# Patient Record
Sex: Female | Born: 1945 | Race: White | Hispanic: No | Marital: Married | Smoking: Former smoker
Health system: Southern US, Community
[De-identification: ages and names within clinical notes are randomized; demographics above are authoritative.]

## PROBLEM LIST (undated history)

## (undated) DIAGNOSIS — Z923 Personal history of irradiation: Secondary | ICD-10-CM

## (undated) DIAGNOSIS — I10 Essential (primary) hypertension: Secondary | ICD-10-CM

## (undated) DIAGNOSIS — E119 Type 2 diabetes mellitus without complications: Secondary | ICD-10-CM

## (undated) DIAGNOSIS — C342 Malignant neoplasm of middle lobe, bronchus or lung: Secondary | ICD-10-CM

## (undated) DIAGNOSIS — M797 Fibromyalgia: Secondary | ICD-10-CM

## (undated) DIAGNOSIS — R05 Cough: Secondary | ICD-10-CM

## (undated) DIAGNOSIS — C3490 Malignant neoplasm of unspecified part of unspecified bronchus or lung: Secondary | ICD-10-CM

## (undated) DIAGNOSIS — J439 Emphysema, unspecified: Secondary | ICD-10-CM

## (undated) DIAGNOSIS — E785 Hyperlipidemia, unspecified: Secondary | ICD-10-CM

## (undated) DIAGNOSIS — K219 Gastro-esophageal reflux disease without esophagitis: Secondary | ICD-10-CM

## (undated) HISTORY — DX: Hyperlipidemia, unspecified: E78.5

## (undated) HISTORY — DX: Cough: R05

## (undated) HISTORY — DX: Fibromyalgia: M79.7

## (undated) HISTORY — DX: Essential (primary) hypertension: I10

## (undated) HISTORY — DX: Malignant neoplasm of middle lobe, bronchus or lung: C34.2

## (undated) HISTORY — DX: Type 2 diabetes mellitus without complications: E11.9

## (undated) HISTORY — DX: Emphysema, unspecified: J43.9

---

## 2009-12-18 HISTORY — PX: LUNG BIOPSY: SHX232

## 2009-12-24 ENCOUNTER — Ambulatory Visit: Payer: Self-pay | Admitting: Oncology

## 2010-01-01 ENCOUNTER — Ambulatory Visit
Admission: RE | Admit: 2010-01-01 | Discharge: 2010-01-29 | Payer: Self-pay | Source: Home / Self Care | Admitting: Radiation Oncology

## 2010-01-02 LAB — CBC WITH DIFFERENTIAL/PLATELET
BASO%: 0.3 % (ref 0.0–2.0)
Basophils Absolute: 0 10*3/uL (ref 0.0–0.1)
EOS%: 1.6 % (ref 0.0–7.0)
Eosinophils Absolute: 0.2 10*3/uL (ref 0.0–0.5)
HCT: 45 % (ref 34.8–46.6)
HGB: 15.5 g/dL (ref 11.6–15.9)
LYMPH%: 17.4 % (ref 14.0–49.7)
MCH: 30.6 pg (ref 25.1–34.0)
MCHC: 34.4 g/dL (ref 31.5–36.0)
MCV: 88.7 fL (ref 79.5–101.0)
MONO#: 0.5 10*3/uL (ref 0.1–0.9)
MONO%: 5 % (ref 0.0–14.0)
NEUT#: 7.5 10*3/uL — ABNORMAL HIGH (ref 1.5–6.5)
NEUT%: 75.7 % (ref 38.4–76.8)
Platelets: 234 10*3/uL (ref 145–400)
RBC: 5.07 10*6/uL (ref 3.70–5.45)
RDW: 14 % (ref 11.2–14.5)
WBC: 9.9 10*3/uL (ref 3.9–10.3)
lymph#: 1.7 10*3/uL (ref 0.9–3.3)

## 2010-01-02 LAB — COMPREHENSIVE METABOLIC PANEL
ALT: 20 U/L (ref 0–35)
AST: 20 U/L (ref 0–37)
Albumin: 4.1 g/dL (ref 3.5–5.2)
Alkaline Phosphatase: 65 U/L (ref 39–117)
BUN: 10 mg/dL (ref 6–23)
CO2: 27 mEq/L (ref 19–32)
Calcium: 9.6 mg/dL (ref 8.4–10.5)
Chloride: 103 mEq/L (ref 96–112)
Creatinine, Ser: 0.75 mg/dL (ref 0.40–1.20)
Glucose, Bld: 119 mg/dL — ABNORMAL HIGH (ref 70–99)
Potassium: 3.9 mEq/L (ref 3.5–5.3)
Sodium: 139 mEq/L (ref 135–145)
Total Bilirubin: 0.9 mg/dL (ref 0.3–1.2)
Total Protein: 6.5 g/dL (ref 6.0–8.3)

## 2010-01-02 LAB — LACTATE DEHYDROGENASE: LDH: 129 U/L (ref 94–250)

## 2010-03-22 ENCOUNTER — Other Ambulatory Visit: Payer: Self-pay | Admitting: Radiation Oncology

## 2010-03-22 DIAGNOSIS — C3490 Malignant neoplasm of unspecified part of unspecified bronchus or lung: Secondary | ICD-10-CM

## 2010-04-17 ENCOUNTER — Ambulatory Visit (HOSPITAL_COMMUNITY)
Admission: RE | Admit: 2010-04-17 | Discharge: 2010-04-17 | Disposition: A | Discharge status: Home / Self Care | Payer: PRIVATE HEALTH INSURANCE | Source: Ambulatory Visit | Attending: Radiation Oncology | Admitting: Radiation Oncology

## 2010-04-17 ENCOUNTER — Encounter (HOSPITAL_COMMUNITY): Payer: Self-pay

## 2010-04-17 DIAGNOSIS — C3490 Malignant neoplasm of unspecified part of unspecified bronchus or lung: Secondary | ICD-10-CM

## 2010-04-17 DIAGNOSIS — J8410 Pulmonary fibrosis, unspecified: Secondary | ICD-10-CM | POA: Insufficient documentation

## 2010-04-17 DIAGNOSIS — I700 Atherosclerosis of aorta: Secondary | ICD-10-CM | POA: Insufficient documentation

## 2010-04-17 DIAGNOSIS — Z85118 Personal history of other malignant neoplasm of bronchus and lung: Secondary | ICD-10-CM | POA: Insufficient documentation

## 2010-04-17 HISTORY — DX: Malignant neoplasm of unspecified part of unspecified bronchus or lung: C34.90

## 2010-04-24 ENCOUNTER — Ambulatory Visit: Payer: PRIVATE HEALTH INSURANCE | Attending: Radiation Oncology | Admitting: Radiation Oncology

## 2010-05-28 ENCOUNTER — Other Ambulatory Visit: Payer: Self-pay | Admitting: Oncology

## 2010-05-28 ENCOUNTER — Encounter (HOSPITAL_BASED_OUTPATIENT_CLINIC_OR_DEPARTMENT_OTHER): Payer: PRIVATE HEALTH INSURANCE | Admitting: Oncology

## 2010-05-28 DIAGNOSIS — J438 Other emphysema: Secondary | ICD-10-CM

## 2010-05-28 DIAGNOSIS — C342 Malignant neoplasm of middle lobe, bronchus or lung: Secondary | ICD-10-CM

## 2010-05-28 LAB — CBC WITH DIFFERENTIAL/PLATELET
BASO%: 0.5 % (ref 0.0–2.0)
Basophils Absolute: 0 10*3/uL (ref 0.0–0.1)
EOS%: 1.5 % (ref 0.0–7.0)
Eosinophils Absolute: 0.1 10*3/uL (ref 0.0–0.5)
HCT: 42.5 % (ref 34.8–46.6)
HGB: 14.3 g/dL (ref 11.6–15.9)
LYMPH%: 10.1 % — ABNORMAL LOW (ref 14.0–49.7)
MCH: 30.5 pg (ref 25.1–34.0)
MCHC: 33.8 g/dL (ref 31.5–36.0)
MCV: 90.2 fL (ref 79.5–101.0)
MONO#: 0.2 10*3/uL (ref 0.1–0.9)
MONO%: 2.4 % (ref 0.0–14.0)
NEUT#: 7.4 10*3/uL — ABNORMAL HIGH (ref 1.5–6.5)
NEUT%: 85.5 % — ABNORMAL HIGH (ref 38.4–76.8)
Platelets: 164 10*3/uL (ref 145–400)
RBC: 4.71 10*6/uL (ref 3.70–5.45)
RDW: 13.4 % (ref 11.2–14.5)
WBC: 8.7 10*3/uL (ref 3.9–10.3)
lymph#: 0.9 10*3/uL (ref 0.9–3.3)

## 2010-05-28 LAB — COMPREHENSIVE METABOLIC PANEL
ALT: 11 U/L (ref 0–35)
AST: 9 U/L (ref 0–37)
Albumin: 4.1 g/dL (ref 3.5–5.2)
Alkaline Phosphatase: 63 U/L (ref 39–117)
BUN: 10 mg/dL (ref 6–23)
CO2: 28 mEq/L (ref 19–32)
Calcium: 9.1 mg/dL (ref 8.4–10.5)
Chloride: 102 mEq/L (ref 96–112)
Creatinine, Ser: 0.75 mg/dL (ref 0.40–1.20)
Glucose, Bld: 155 mg/dL — ABNORMAL HIGH (ref 70–99)
Potassium: 4.1 mEq/L (ref 3.5–5.3)
Sodium: 141 mEq/L (ref 135–145)
Total Bilirubin: 0.6 mg/dL (ref 0.3–1.2)
Total Protein: 6.2 g/dL (ref 6.0–8.3)

## 2010-05-28 LAB — LACTATE DEHYDROGENASE: LDH: 137 U/L (ref 94–250)

## 2010-06-04 ENCOUNTER — Other Ambulatory Visit: Payer: Self-pay | Admitting: Oncology

## 2010-06-04 ENCOUNTER — Encounter (HOSPITAL_BASED_OUTPATIENT_CLINIC_OR_DEPARTMENT_OTHER): Payer: PRIVATE HEALTH INSURANCE | Admitting: Oncology

## 2010-06-04 DIAGNOSIS — I10 Essential (primary) hypertension: Secondary | ICD-10-CM

## 2010-06-04 DIAGNOSIS — J438 Other emphysema: Secondary | ICD-10-CM

## 2010-06-04 DIAGNOSIS — E785 Hyperlipidemia, unspecified: Secondary | ICD-10-CM

## 2010-06-04 DIAGNOSIS — C342 Malignant neoplasm of middle lobe, bronchus or lung: Secondary | ICD-10-CM

## 2010-06-04 DIAGNOSIS — C3490 Malignant neoplasm of unspecified part of unspecified bronchus or lung: Secondary | ICD-10-CM

## 2010-10-08 ENCOUNTER — Other Ambulatory Visit: Payer: Self-pay | Admitting: Oncology

## 2010-10-08 ENCOUNTER — Encounter (HOSPITAL_BASED_OUTPATIENT_CLINIC_OR_DEPARTMENT_OTHER): Payer: PRIVATE HEALTH INSURANCE | Admitting: Oncology

## 2010-10-08 ENCOUNTER — Ambulatory Visit (HOSPITAL_COMMUNITY)
Admission: RE | Admit: 2010-10-08 | Discharge: 2010-10-08 | Disposition: A | Discharge status: Home / Self Care | Payer: PRIVATE HEALTH INSURANCE | Source: Ambulatory Visit | Attending: Oncology | Admitting: Oncology

## 2010-10-08 ENCOUNTER — Encounter (HOSPITAL_COMMUNITY): Payer: Self-pay

## 2010-10-08 DIAGNOSIS — J984 Other disorders of lung: Secondary | ICD-10-CM | POA: Insufficient documentation

## 2010-10-08 DIAGNOSIS — J438 Other emphysema: Secondary | ICD-10-CM | POA: Insufficient documentation

## 2010-10-08 DIAGNOSIS — I080 Rheumatic disorders of both mitral and aortic valves: Secondary | ICD-10-CM | POA: Insufficient documentation

## 2010-10-08 DIAGNOSIS — C3490 Malignant neoplasm of unspecified part of unspecified bronchus or lung: Secondary | ICD-10-CM

## 2010-10-08 DIAGNOSIS — J9819 Other pulmonary collapse: Secondary | ICD-10-CM | POA: Insufficient documentation

## 2010-10-08 DIAGNOSIS — C342 Malignant neoplasm of middle lobe, bronchus or lung: Secondary | ICD-10-CM

## 2010-10-08 HISTORY — DX: Essential (primary) hypertension: I10

## 2010-10-08 LAB — CMP (CANCER CENTER ONLY)
ALT(SGPT): 15 U/L (ref 10–47)
AST: 12 U/L (ref 11–38)
Albumin: 3.7 g/dL (ref 3.3–5.5)
Alkaline Phosphatase: 58 U/L (ref 26–84)
Potassium: 4.6 mEq/L (ref 3.3–4.7)
Sodium: 136 mEq/L (ref 128–145)
Total Bilirubin: 0.8 mg/dl (ref 0.20–1.60)
Total Protein: 6.3 g/dL — ABNORMAL LOW (ref 6.4–8.1)

## 2010-10-08 LAB — CBC WITH DIFFERENTIAL/PLATELET
BASO%: 0.1 % (ref 0.0–2.0)
LYMPH%: 8.4 % — ABNORMAL LOW (ref 14.0–49.7)
MCHC: 34.3 g/dL (ref 31.5–36.0)
MCV: 90 fL (ref 79.5–101.0)
MONO%: 1.4 % (ref 0.0–14.0)
NEUT#: 9.2 10*3/uL — ABNORMAL HIGH (ref 1.5–6.5)
Platelets: 208 10*3/uL (ref 145–400)
RBC: 4.87 10*6/uL (ref 3.70–5.45)
RDW: 14.1 % (ref 11.2–14.5)
WBC: 10.3 10*3/uL (ref 3.9–10.3)

## 2010-10-08 MED ORDER — IOHEXOL 300 MG/ML  SOLN
80.0000 mL | Freq: Once | INTRAMUSCULAR | Status: AC | PRN
  Administered 2010-10-08: 80 mL via INTRAVENOUS

## 2010-10-15 ENCOUNTER — Encounter (HOSPITAL_BASED_OUTPATIENT_CLINIC_OR_DEPARTMENT_OTHER): Payer: PRIVATE HEALTH INSURANCE | Admitting: Oncology

## 2010-10-15 DIAGNOSIS — C342 Malignant neoplasm of middle lobe, bronchus or lung: Secondary | ICD-10-CM

## 2010-10-15 DIAGNOSIS — J438 Other emphysema: Secondary | ICD-10-CM

## 2010-10-15 DIAGNOSIS — Z9981 Dependence on supplemental oxygen: Secondary | ICD-10-CM

## 2010-10-15 DIAGNOSIS — I10 Essential (primary) hypertension: Secondary | ICD-10-CM

## 2011-01-25 ENCOUNTER — Telehealth: Payer: Self-pay | Admitting: Oncology

## 2011-01-25 NOTE — Telephone Encounter (Signed)
Talked to pt, gave her MD appt in February.Reminded pt that scan and lab are still scheduled for 12/19th

## 2011-02-19 ENCOUNTER — Other Ambulatory Visit (HOSPITAL_BASED_OUTPATIENT_CLINIC_OR_DEPARTMENT_OTHER): Payer: PRIVATE HEALTH INSURANCE | Admitting: Lab

## 2011-02-19 ENCOUNTER — Other Ambulatory Visit: Payer: Self-pay | Admitting: Oncology

## 2011-02-19 ENCOUNTER — Ambulatory Visit (HOSPITAL_COMMUNITY)
Admission: RE | Admit: 2011-02-19 | Discharge: 2011-02-19 | Disposition: A | Discharge status: Home / Self Care | Payer: Medicare Other | Source: Ambulatory Visit | Attending: Oncology | Admitting: Oncology

## 2011-02-19 DIAGNOSIS — Z85118 Personal history of other malignant neoplasm of bronchus and lung: Secondary | ICD-10-CM | POA: Insufficient documentation

## 2011-02-19 DIAGNOSIS — C342 Malignant neoplasm of middle lobe, bronchus or lung: Secondary | ICD-10-CM

## 2011-02-19 DIAGNOSIS — C801 Malignant (primary) neoplasm, unspecified: Secondary | ICD-10-CM

## 2011-02-19 DIAGNOSIS — R05 Cough: Secondary | ICD-10-CM | POA: Insufficient documentation

## 2011-02-19 DIAGNOSIS — R0602 Shortness of breath: Secondary | ICD-10-CM | POA: Insufficient documentation

## 2011-02-19 DIAGNOSIS — I10 Essential (primary) hypertension: Secondary | ICD-10-CM

## 2011-02-19 DIAGNOSIS — J438 Other emphysema: Secondary | ICD-10-CM

## 2011-02-19 DIAGNOSIS — J449 Chronic obstructive pulmonary disease, unspecified: Secondary | ICD-10-CM | POA: Insufficient documentation

## 2011-02-19 DIAGNOSIS — R059 Cough, unspecified: Secondary | ICD-10-CM | POA: Insufficient documentation

## 2011-02-19 DIAGNOSIS — Z9981 Dependence on supplemental oxygen: Secondary | ICD-10-CM

## 2011-02-19 DIAGNOSIS — J4489 Other specified chronic obstructive pulmonary disease: Secondary | ICD-10-CM | POA: Insufficient documentation

## 2011-02-19 LAB — CBC WITH DIFFERENTIAL/PLATELET
BASO%: 0.1 % (ref 0.0–2.0)
EOS%: 0.7 % (ref 0.0–7.0)
Eosinophils Absolute: 0.1 10*3/uL (ref 0.0–0.5)
LYMPH%: 18.1 % (ref 14.0–49.7)
MCH: 29.7 pg (ref 25.1–34.0)
MCHC: 33.9 g/dL (ref 31.5–36.0)
MCV: 87.5 fL (ref 79.5–101.0)
MONO%: 7.6 % (ref 0.0–14.0)
Platelets: 227 10*3/uL (ref 145–400)
RBC: 4.98 10*6/uL (ref 3.70–5.45)

## 2011-02-19 LAB — COMPREHENSIVE METABOLIC PANEL
AST: 15 U/L (ref 0–37)
Alkaline Phosphatase: 67 U/L (ref 39–117)
Glucose, Bld: 113 mg/dL — ABNORMAL HIGH (ref 70–99)
Sodium: 141 mEq/L (ref 135–145)
Total Bilirubin: 0.4 mg/dL (ref 0.3–1.2)
Total Protein: 6.8 g/dL (ref 6.0–8.3)

## 2011-02-19 LAB — LACTATE DEHYDROGENASE: LDH: 157 U/L (ref 94–250)

## 2011-03-13 ENCOUNTER — Telehealth: Payer: Self-pay | Admitting: *Deleted

## 2011-03-13 NOTE — Telephone Encounter (Signed)
Received call from pt. Stating that her right side of chest hurts & has been worse last several days.  She had a CXR recently but hasn't heard results & wonders if she has a broken rib.  She reports her pain # 8 on scale of 1-10 but mainly hurts getting up & down or  moving  & hurts some with a deep breath.  She reports no other symptoms, no cold, no fever, no congestion, etc.   She is taking ibuprofen qid & doesn't want anything stronger & states this helps some.  Suggested heat for 10-15" qid & will discuss Dr. Truett Perna.

## 2011-03-13 NOTE — Telephone Encounter (Signed)
Discussed above with Dr. Truett Perna & he suggested pt call PCP or go to ED if worse.  The pt was notified of this.

## 2011-04-18 ENCOUNTER — Ambulatory Visit (HOSPITAL_BASED_OUTPATIENT_CLINIC_OR_DEPARTMENT_OTHER): Payer: Medicare Other | Admitting: Oncology

## 2011-04-18 ENCOUNTER — Encounter: Payer: Self-pay | Admitting: Oncology

## 2011-04-18 VITALS — BP 137/79 | HR 87 | Temp 98.0°F | Ht 65.5 in | Wt 157.7 lb

## 2011-04-18 DIAGNOSIS — R053 Chronic cough: Secondary | ICD-10-CM

## 2011-04-18 DIAGNOSIS — J449 Chronic obstructive pulmonary disease, unspecified: Secondary | ICD-10-CM

## 2011-04-18 DIAGNOSIS — C3490 Malignant neoplasm of unspecified part of unspecified bronchus or lung: Secondary | ICD-10-CM

## 2011-04-18 DIAGNOSIS — J209 Acute bronchitis, unspecified: Secondary | ICD-10-CM

## 2011-04-18 DIAGNOSIS — C342 Malignant neoplasm of middle lobe, bronchus or lung: Secondary | ICD-10-CM

## 2011-04-18 DIAGNOSIS — J42 Unspecified chronic bronchitis: Secondary | ICD-10-CM

## 2011-04-18 DIAGNOSIS — J439 Emphysema, unspecified: Secondary | ICD-10-CM

## 2011-04-18 DIAGNOSIS — E119 Type 2 diabetes mellitus without complications: Secondary | ICD-10-CM

## 2011-04-18 DIAGNOSIS — R05 Cough: Secondary | ICD-10-CM | POA: Insufficient documentation

## 2011-04-18 DIAGNOSIS — M797 Fibromyalgia: Secondary | ICD-10-CM

## 2011-04-18 DIAGNOSIS — I10 Essential (primary) hypertension: Secondary | ICD-10-CM

## 2011-04-18 DIAGNOSIS — Z85118 Personal history of other malignant neoplasm of bronchus and lung: Secondary | ICD-10-CM

## 2011-04-18 DIAGNOSIS — E785 Hyperlipidemia, unspecified: Secondary | ICD-10-CM

## 2011-04-18 HISTORY — DX: Essential (primary) hypertension: I10

## 2011-04-18 HISTORY — DX: Malignant neoplasm of middle lobe, bronchus or lung: C34.2

## 2011-04-18 HISTORY — DX: Fibromyalgia: M79.7

## 2011-04-18 HISTORY — DX: Chronic cough: R05.3

## 2011-04-18 HISTORY — DX: Emphysema, unspecified: J43.9

## 2011-04-18 HISTORY — DX: Type 2 diabetes mellitus without complications: E11.9

## 2011-04-18 HISTORY — DX: Hyperlipidemia, unspecified: E78.5

## 2011-04-18 NOTE — Progress Notes (Signed)
Hematology and Oncology Follow Up Visit  Teresa Anthony [id number] November 11, 1945 66 y.o. 04/18/2011 4:42 PM   Principle Diagnosis: Encounter Diagnoses  Name Primary?  . Non-small cell lung cancer Yes  . Lung cancer, middle lobe   . COPD (chronic obstructive pulmonary disease) with emphysema   . Chronic cough   . Benign essential HTN   . DM type 2 (diabetes mellitus, type 2)   . Hyperlipidemia   . Fibromyalgia syndrome   . 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 She was treated with primary radiation. Between November 17 and 01/29/2010. She achieved a complete radiographic response. She has a chronic smoker's cough. Cough got worse about 3 weeks ago. She saw her internist. She was put on a 10 day course of antibiotics. She had a significant component of right pleuritic chest pain. In view of her cancer history a CT scan of the chest was done on 03/26/2011 at the [facility] in [place]. She brought me a copy of the CD but I can't get a computed to read it. Report states a new infiltrate identified in the right midlung which is probably infectious. No mediastinal adenopathy. No pleural effusions. No new fractures but an old right rib fracture identified. Short interval followup study was recommended. This was done on February 9. I don't have the report for that study but patient was told that things were improving. Her cough has improved back to her baseline. The pleuritic chest discomfort has also nearly completely resolved. She's had no other interim medical problems.   Medications: reviewed  Allergies: Not on File  Review of Systems: Constitutional:   No constitutional symptoms other than chronic fatigue Respiratory: See  above Cardiovascular:  No ischemic type chest pain pressure or palpitations Gastrointestinal: No abdominal pain change in bowel habit Genito-Urinary: No urinary tract Musculoskeletal: Chronic muscle ache Neurologic: No severe headache or change in vision Skin: No rash Remaining ROS negative.  Physical Exam: Blood pressure 137/79, pulse 87, temperature 98 F (36.7 C), temperature source Oral, height 5' 5.5" (1.664 m), weight 157 lb 11.2 oz (71.532 kg). Wt Readings from Last 3 Encounters:  04/18/11 157 lb 11.2 oz (71.532 kg)     General appearance: Chronically ill-appearing face is flushed HENNT: Pharynx no erythema or exudate Lymph nodes: No cervical supraclavicular or axillary adenopathy  Breasts: Not examined Lungs: Clear to auscultation prolonged expiratory phase with some expiratory wheezes resonant to percussion throughout no egophony Heart: Regular cardiac rhythm no murmur no gallop Abdomen: Soft nontender no mass no organomegaly Extremities: No edema no calf tenderness Vascular: No cyanosis Neurologic: Alert oriented motor strength 5 over 5 reflexes 2+ symmetric Skin: No rash or ecchymosis  Lab Results: Lab Results  Component Value Date   WBC 9.6 02/19/2011   HGB 14.8 02/19/2011   HCT 43.6 02/19/2011   MCV 87.5 02/19/2011   PLT 227 02/19/2011     Chemistry      Component Value Date/Time   NA 141 02/19/2011 1323   NA 136 10/08/2010 0953   K 3.9 02/19/2011 1323   K 4.6 10/08/2010 0953   CL 105 02/19/2011 1323   CL 95* 10/08/2010 0953   CO2 22 02/19/2011 1323   CO2 27 10/08/2010 0953   BUN 15 02/19/2011 1323  BUN 17 10/08/2010 0953   CREATININE 0.69 02/19/2011 1323   CREATININE 0.8 10/08/2010 0953      Component Value Date/Time   CALCIUM 9.8 02/19/2011 1323   CALCIUM 9.0 10/08/2010 0953   ALKPHOS 67 02/19/2011 1323   ALKPHOS 58 10/08/2010 0953   AST 15 02/19/2011 1323   AST 12 10/08/2010 0953   ALT 14 02/19/2011 1323   BILITOT 0.4 02/19/2011 1323   BILITOT 0.80  10/08/2010 4098       Radiological Studies: See discussion above   Impression and Plan: #1. Clinical stage I squamous cell carcinoma right lung treated with primary radiation. She remains free of any obvious new disease now out a little over 2 years from diagnosis in October 2011. I'll repeat a CT scan again in 4 months. Increase interval between scans about one is stable.  #2. Recent acute bronchitis treated as outlined above. No convincing evidence for new cancer on CT scans done in January and February of this year.  #3. Oxygen dependent obstructive airway disease.  #4. Type 2 diabetes on oral agent.  #5. Fibromyalgia syndrome by history.  #6. Essential hypertension.  #7. Hyperlipidemia   CC:. Dr. Synetta Fail; Dr. Kenney Houseman; Dr. Chipper Herb   Levert Feinstein, MD 2/15/20134:42 PM

## 2011-08-08 ENCOUNTER — Other Ambulatory Visit (HOSPITAL_BASED_OUTPATIENT_CLINIC_OR_DEPARTMENT_OTHER): Payer: Medicare Other

## 2011-08-08 ENCOUNTER — Ambulatory Visit (HOSPITAL_COMMUNITY)
Admission: RE | Admit: 2011-08-08 | Discharge: 2011-08-08 | Disposition: A | Discharge status: Home / Self Care | Payer: Medicare Other | Source: Ambulatory Visit | Attending: Oncology | Admitting: Oncology

## 2011-08-08 ENCOUNTER — Telehealth: Payer: Self-pay | Admitting: *Deleted

## 2011-08-08 ENCOUNTER — Encounter (HOSPITAL_COMMUNITY): Payer: Self-pay

## 2011-08-08 DIAGNOSIS — I059 Rheumatic mitral valve disease, unspecified: Secondary | ICD-10-CM | POA: Insufficient documentation

## 2011-08-08 DIAGNOSIS — K7689 Other specified diseases of liver: Secondary | ICD-10-CM | POA: Insufficient documentation

## 2011-08-08 DIAGNOSIS — J984 Other disorders of lung: Secondary | ICD-10-CM | POA: Insufficient documentation

## 2011-08-08 DIAGNOSIS — I7789 Other specified disorders of arteries and arterioles: Secondary | ICD-10-CM | POA: Insufficient documentation

## 2011-08-08 DIAGNOSIS — Z923 Personal history of irradiation: Secondary | ICD-10-CM | POA: Insufficient documentation

## 2011-08-08 DIAGNOSIS — C3490 Malignant neoplasm of unspecified part of unspecified bronchus or lung: Secondary | ICD-10-CM | POA: Insufficient documentation

## 2011-08-08 DIAGNOSIS — I700 Atherosclerosis of aorta: Secondary | ICD-10-CM | POA: Insufficient documentation

## 2011-08-08 LAB — CBC WITH DIFFERENTIAL/PLATELET
Basophils Absolute: 0 10*3/uL (ref 0.0–0.1)
Eosinophils Absolute: 0.1 10*3/uL (ref 0.0–0.5)
HCT: 42.8 % (ref 34.8–46.6)
HGB: 14.6 g/dL (ref 11.6–15.9)
LYMPH%: 18.2 % (ref 14.0–49.7)
MCV: 89.9 fL (ref 79.5–101.0)
MONO%: 8.1 % (ref 0.0–14.0)
NEUT#: 5.7 10*3/uL (ref 1.5–6.5)
Platelets: 222 10*3/uL (ref 145–400)

## 2011-08-08 LAB — CMP (CANCER CENTER ONLY)
Albumin: 4.2 g/dL (ref 3.3–5.5)
Alkaline Phosphatase: 66 U/L (ref 26–84)
BUN, Bld: 14 mg/dL (ref 7–22)
Glucose, Bld: 94 mg/dL (ref 73–118)
Total Bilirubin: 0.9 mg/dl (ref 0.20–1.60)

## 2011-08-08 MED ORDER — IOHEXOL 300 MG/ML  SOLN
80.0000 mL | Freq: Once | INTRAMUSCULAR | Status: AC | PRN
  Administered 2011-08-08: 80 mL via INTRAVENOUS

## 2011-08-08 NOTE — Telephone Encounter (Signed)
Pt notified of CT results per Dr Granfortuna.  

## 2011-08-08 NOTE — Telephone Encounter (Signed)
Message copied by Sabino Snipes on Fri Aug 08, 2011  5:52 PM ------      Message from: Levert Feinstein      Created: Fri Aug 08, 2011  5:29 PM       Call pt CT chest negative for lung ca

## 2011-08-15 ENCOUNTER — Ambulatory Visit (HOSPITAL_BASED_OUTPATIENT_CLINIC_OR_DEPARTMENT_OTHER): Payer: Medicare Other | Admitting: Nurse Practitioner

## 2011-08-15 VITALS — BP 125/75 | HR 97 | Temp 97.3°F | Ht 65.5 in | Wt 159.3 lb

## 2011-08-15 DIAGNOSIS — E119 Type 2 diabetes mellitus without complications: Secondary | ICD-10-CM

## 2011-08-15 DIAGNOSIS — C342 Malignant neoplasm of middle lobe, bronchus or lung: Secondary | ICD-10-CM

## 2011-08-15 DIAGNOSIS — I10 Essential (primary) hypertension: Secondary | ICD-10-CM

## 2011-08-15 DIAGNOSIS — J988 Other specified respiratory disorders: Secondary | ICD-10-CM

## 2011-08-15 NOTE — Progress Notes (Signed)
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 She was treated with primary radiation 01/17/2010 through 01/29/2010. She achieved a complete radiographic response.  Most recent chest CT 08/08/2011 showed no evidence of local recurrence or metastatic disease. There were stable scarring and emphysematous changes in both lungs. No lymphadenopathy or pleural effusion. There were old rib fractures or thoracotomy defect laterally on the right.  Ms. Neuwirth reports that overall she feels well. No interim illnesses or infections. She has stable dyspnea. She utilizes supplemental oxygen at nighttime and during the day as needed at 2 L per minute. Cough is markedly improved. She continues to have intermittent mild "pleurisy" at the right lateral chest. She has a good appetite. Bowels moving regularly. No urinary complaints.  Objective: Blood pressure 125/75, pulse 97, temperature 97.3 F (36.3 C), temperature source Oral, height 5' 5.5" (1.664 m), weight 159 lb 4.8 oz (72.258 kg).  Oropharynx is without thrush or ulceration. Mucous membranes are pink and moist. No palpable cervical, supraclavicular or axillary lymph nodes. Lungs with scattered wheezes. Regular cardiac rhythm. Abdomen soft and nontender. No hepatomegaly. Extremities are without edema. Calves are soft and nontender. Motor strength 5 over 5. Knee DTRs 3+, symmetric.  Lab Results: Lab Results  Component Value Date   WBC 7.9 08/08/2011   HGB 14.6 08/08/2011   HCT 42.8 08/08/2011   MCV 89.9 08/08/2011   PLT 222 08/08/2011    Chemistry:    Chemistry      Component Value Date/Time   NA 144 08/08/2011 1344   NA 141 02/19/2011 1323   K 4.6 08/08/2011 1344   K 3.9 02/19/2011 1323   CL 103 08/08/2011 1344   CL 105 02/19/2011 1323   CO2 28 08/08/2011 1344   CO2 22 02/19/2011 1323   BUN 14 08/08/2011 1344   BUN 15 02/19/2011 1323   CREATININE 0.9 08/08/2011 1344   CREATININE 0.69 02/19/2011 1323      Component Value Date/Time   CALCIUM 9.3 08/08/2011 1344   CALCIUM 9.8 02/19/2011 1323   ALKPHOS 66 08/08/2011 1344   ALKPHOS 67 02/19/2011 1323   AST 21 08/08/2011 1344   AST 15 02/19/2011 1323   ALT 14 02/19/2011 1323   BILITOT 0.90 08/08/2011 1344   BILITOT 0.4 02/19/2011 1323       Studies/Results: Ct Chest W Contrast  08/08/2011  *RADIOLOGY REPORT*  Clinical Data: Follow-up lung cancer diagnosed in 2011 status post radiation therapy completion.  CT CHEST WITH CONTRAST  Technique:  Multidetector CT imaging of the chest was performed following the standard protocol during bolus administration of intravenous contrast.  Contrast: 80mL OMNIPAQUE IOHEXOL 300 MG/ML  SOLN  Comparison: chest CT 10/08/2010 and 04/17/2010.  Findings: There are no enlarged mediastinal or hilar lymph nodes. A small amount of pericardial thickening anteriorly appears stable. There is no pleural or pericardial effusion.  Mitral annular calcifications and mild aortic atherosclerosis are again noted. There is central enlargement of the pulmonary arteries consistent with pulmonary arterial hypertension.  The lungs appear stable.  There are emphysematous changes with scarring in the right middle and lower lobes and along the left major fissure.  No recurrent mass or endobronchial lesion is seen.  Images through the upper abdomen demonstrate no adrenal mass. There is a small hypervascular lesion peripherally in the posterior segment of the right hepatic lobe.  This measures 8 mm in diameter  on image 52.  This is not as well seen on the prior studies, although was probably present on the examination from 10 months ago.  There are no suspicious osseous findings.  There are old rib fractures or thoracotomy defects laterally on the right.  No bone destruction is evident.  IMPRESSION:  1.  Stable scarring and emphysematous changes in both lungs.  No evidence of local  recurrence or metastatic disease. 2.  No lymphadenopathy or pleural effusion. 3.  Old rib fractures or thoracotomy defect on the right. 4.  Small hypervascular liver lesion, probably previously present and therefore likely a hemangioma or transient hepatic attenuation difference.  Metastatic disease is considered unlikely.  Original Report Authenticated By: Gerrianne Scale, M.D.    Medications: I have reviewed the patient's current medications.  Assessment/Plan:  1. Clinical stage I squamous cell carcinoma, right middle lobe, (T1b Nx M0) status post primary radiation 11/17 through 01/29/2010 with a complete response. 2. Advanced obstructive airway disease, oxygen dependent.   3. Hypertension -- she takes Cozaar and Norvasc.  4. Type 2 diabetes--on Metformin. 5. Hyperlipidemia.  Disposition-Ms. Quintanilla appears stable. She will return for a chest x-ray and followup visit in 6 months. She will contact the office in the interim with any problems.  Plan reviewed with Dr. Cyndie Chime.  Lonna Cobb ANP/GNP-BC

## 2011-08-22 ENCOUNTER — Telehealth: Payer: Self-pay | Admitting: Oncology

## 2011-08-22 NOTE — Telephone Encounter (Signed)
called pt and provided appt for 12/13 along with CXR appt same day

## 2012-01-16 ENCOUNTER — Telehealth: Payer: Self-pay | Admitting: Oncology

## 2012-01-16 ENCOUNTER — Other Ambulatory Visit: Payer: Self-pay | Admitting: Nurse Practitioner

## 2012-01-16 DIAGNOSIS — C342 Malignant neoplasm of middle lobe, bronchus or lung: Secondary | ICD-10-CM

## 2012-01-16 NOTE — Telephone Encounter (Signed)
Pt aware to have x-ray before MD visit on December 2013

## 2012-02-06 ENCOUNTER — Ambulatory Visit (HOSPITAL_COMMUNITY)
Admission: RE | Admit: 2012-02-06 | Discharge: 2012-02-06 | Disposition: A | Discharge status: Home / Self Care | Payer: Medicare Other | Source: Ambulatory Visit | Attending: Nurse Practitioner | Admitting: Nurse Practitioner

## 2012-02-06 DIAGNOSIS — C342 Malignant neoplasm of middle lobe, bronchus or lung: Secondary | ICD-10-CM

## 2012-02-06 DIAGNOSIS — J438 Other emphysema: Secondary | ICD-10-CM | POA: Insufficient documentation

## 2012-02-06 DIAGNOSIS — J984 Other disorders of lung: Secondary | ICD-10-CM | POA: Insufficient documentation

## 2012-02-13 ENCOUNTER — Ambulatory Visit (HOSPITAL_BASED_OUTPATIENT_CLINIC_OR_DEPARTMENT_OTHER): Payer: Medicare Other | Admitting: Oncology

## 2012-02-13 ENCOUNTER — Other Ambulatory Visit (HOSPITAL_BASED_OUTPATIENT_CLINIC_OR_DEPARTMENT_OTHER): Payer: Medicare Other | Admitting: Lab

## 2012-02-13 ENCOUNTER — Telehealth: Payer: Self-pay | Admitting: Oncology

## 2012-02-13 VITALS — BP 156/73 | HR 101 | Temp 98.6°F | Resp 20 | Ht 65.5 in | Wt 164.6 lb

## 2012-02-13 DIAGNOSIS — J4489 Other specified chronic obstructive pulmonary disease: Secondary | ICD-10-CM

## 2012-02-13 DIAGNOSIS — R05 Cough: Secondary | ICD-10-CM

## 2012-02-13 DIAGNOSIS — C342 Malignant neoplasm of middle lobe, bronchus or lung: Secondary | ICD-10-CM

## 2012-02-13 DIAGNOSIS — J449 Chronic obstructive pulmonary disease, unspecified: Secondary | ICD-10-CM

## 2012-02-13 DIAGNOSIS — J439 Emphysema, unspecified: Secondary | ICD-10-CM

## 2012-02-13 DIAGNOSIS — IMO0001 Reserved for inherently not codable concepts without codable children: Secondary | ICD-10-CM

## 2012-02-13 DIAGNOSIS — R059 Cough, unspecified: Secondary | ICD-10-CM

## 2012-02-13 LAB — COMPREHENSIVE METABOLIC PANEL (CC13)
AST: 12 U/L (ref 5–34)
Albumin: 3.7 g/dL (ref 3.5–5.0)
BUN: 13 mg/dL (ref 7.0–26.0)
Calcium: 9.7 mg/dL (ref 8.4–10.4)
Chloride: 103 mEq/L (ref 98–107)
Potassium: 3.7 mEq/L (ref 3.5–5.1)
Sodium: 143 mEq/L (ref 136–145)
Total Protein: 6.4 g/dL (ref 6.4–8.3)

## 2012-02-13 LAB — CBC WITH DIFFERENTIAL/PLATELET
Basophils Absolute: 0 10*3/uL (ref 0.0–0.1)
EOS%: 0.3 % (ref 0.0–7.0)
Eosinophils Absolute: 0 10*3/uL (ref 0.0–0.5)
HGB: 14.5 g/dL (ref 11.6–15.9)
MCH: 30.5 pg (ref 25.1–34.0)
NEUT#: 10.6 10*3/uL — ABNORMAL HIGH (ref 1.5–6.5)
RDW: 13.9 % (ref 11.2–14.5)
lymph#: 3 10*3/uL (ref 0.9–3.3)

## 2012-02-13 NOTE — Progress Notes (Signed)
Hematology and Oncology Follow Up Visit  Teresa Anthony [id number] January 27, 1946 66 y.o. 02/13/2012 3:26 PM   Principle Diagnosis: Encounter Diagnoses  Name Primary?  . Lung cancer, middle lobe Yes  . 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 She was treated with primary radiation. Between November 17 and 01/29/2010. She achieved a complete radiographic response. She has had recurrent episodes of bronchitis. She had a another episode beginning about a month ago. She began to get pleuritic chest pain. She believes she waited too long before seeking medical attention. She is currently finishing a course of antibiotics and steroids. Cough is back down to her baseline chronic cough.    Medications: reviewed  Allergies:  Allergies  Allergen Reactions  . Codeine Itching and Rash    Diffuse, severe rash    Review of Systems: Constitutional:   Chronic fatigue Respiratory: Chronic dyspnea Cardiovascular:  No chest pain or palpitations Gastrointestinal: No change in bowel habit Genito-Urinary: Not questioned Musculoskeletal: Chronic myalgias and arthralgias Neurologic: Chronic headaches but not migraine Skin: Bruising due to steroids Remaining ROS negative.  Physical Exam: Blood pressure 156/73, pulse 101, temperature 98.6 F (37 C), temperature source Oral, resp. rate 20, height 5' 5.5" (1.664 m), weight 164 lb 9.6 oz (74.662 kg). Wt Readings from Last 3 Encounters:  02/13/12 164 lb 9.6 oz (74.662 kg)  08/15/11 159 lb 4.8 oz (72.258 kg)  04/18/11 157 lb 11.2 oz (71.532 kg)     General appearance: Chronically ill-appearing Caucasian woman HENNT: Mildly cushingoid. Pharynx no erythema or exudate Lymph nodes: No  adenopathy Breasts: Lungs: overall clear to auscultation and resonant to percussion with scattered inspiratory and expiratory wheezing Heart: Regular rhythm no murmur Abdomen: Soft nontender, no mass, no organomegaly Extremities: No edema, no calf tenderness Vascular: No cyanosis Neurologic: Motor strength 5 over 5, reflexes 2+ symmetric Skin: Scattered ecchymoses on her hands  Lab Results: Lab Results  Component Value Date   WBC 14.7* 02/13/2012   HGB 14.5 02/13/2012   HCT 43.4 02/13/2012   MCV 91.5 02/13/2012   PLT 211 02/13/2012     Chemistry      Component Value Date/Time   NA 143 02/13/2012 1340   NA 144 08/08/2011 1344   NA 141 02/19/2011 1323   K 3.7 02/13/2012 1340   K 4.6 08/08/2011 1344   K 3.9 02/19/2011 1323   CL 103 02/13/2012 1340   CL 103 08/08/2011 1344   CL 105 02/19/2011 1323   CO2 30* 02/13/2012 1340   CO2 28 08/08/2011 1344   CO2 22 02/19/2011 1323   BUN 13.0 02/13/2012 1340   BUN 14 08/08/2011 1344   BUN 15 02/19/2011 1323   CREATININE 0.7 02/13/2012 1340   CREATININE 0.9 08/08/2011 1344   CREATININE 0.69 02/19/2011 1323      Component Value Date/Time   CALCIUM 9.7 02/13/2012 1340   CALCIUM 9.3 08/08/2011 1344   CALCIUM 9.8 02/19/2011 1323   ALKPHOS 69 02/13/2012 1340   ALKPHOS 66 08/08/2011 1344   ALKPHOS 67 02/19/2011 1323   AST 12 02/13/2012 1340   AST 21 08/08/2011 1344   AST 15 02/19/2011 1323   ALT 23 02/13/2012 1340   ALT 14 02/19/2011 1323   BILITOT 0.55 02/13/2012 1340   BILITOT 0.90  08/08/2011 1344   BILITOT 0.4 02/19/2011 1323       Radiological Studies: Dg Chest 2 View  02/06/2012  *RADIOLOGY REPORT*  Clinical Data: History of lung cancer.  CHEST - 2 VIEW  Comparison: CT chest 08/08/2011 and PA and lateral chest 06/16/2011.  Findings: The lungs are emphysematous.  Scarring is seen along the periphery of the right mid lung.  There is increased conspicuity of a nodular opacity measuring 1.4 cm along the periphery of the right lower chest seen  on the PA view only.  The left lung is clear. Heart size is normal.  No pneumothorax or pleural fluid.  IMPRESSION:  1.  Increased conspicuity of a nodular opacity along the periphery of the right lower chest could be due to recurrent lung carcinoma. CT chest with contrast views for further evaluation. 2.  Emphysema.   Original Report Authenticated By: Holley Dexter, M.D.     Impression and Plan: #1. Clinical stage I non-small cell lung cancer. I personally reviewed her current x-ray and the previous chest radiographs and there is absolutely no difference between the current study and a study done in January of this year. However, due to the radiologist's concern, I will go ahead and get a CT scan at this time.  #2. Oxygen-dependent obstructive airway disease  #3. Recurrent bronchitis secondary to #2.  #4. Fibromyalgia  #5. Essential hypertension  #6. Type 2 diabetes  #7. Hyperlipidemia   CC:. Dr. Synetta Fail; Dr. Kenney Houseman; Dr. Chipper Herb   Levert Feinstein, MD 12/13/20133:26 PM

## 2012-02-13 NOTE — Telephone Encounter (Signed)
Gave pt appt for lab and ML for June 2014, Radiology will call patient regarding scans

## 2012-02-13 NOTE — Patient Instructions (Signed)
CT scan next Friday Visit and CXR in 6 months  June 13  At 2:15 PM

## 2012-02-20 ENCOUNTER — Telehealth: Payer: Self-pay | Admitting: *Deleted

## 2012-02-20 ENCOUNTER — Ambulatory Visit (HOSPITAL_COMMUNITY)
Admission: RE | Admit: 2012-02-20 | Discharge: 2012-02-20 | Disposition: A | Discharge status: Home / Self Care | Payer: Medicare Other | Source: Ambulatory Visit | Attending: Oncology | Admitting: Oncology

## 2012-02-20 DIAGNOSIS — J439 Emphysema, unspecified: Secondary | ICD-10-CM

## 2012-02-20 DIAGNOSIS — I2789 Other specified pulmonary heart diseases: Secondary | ICD-10-CM | POA: Insufficient documentation

## 2012-02-20 DIAGNOSIS — I059 Rheumatic mitral valve disease, unspecified: Secondary | ICD-10-CM | POA: Insufficient documentation

## 2012-02-20 DIAGNOSIS — I2510 Atherosclerotic heart disease of native coronary artery without angina pectoris: Secondary | ICD-10-CM | POA: Insufficient documentation

## 2012-02-20 DIAGNOSIS — J479 Bronchiectasis, uncomplicated: Secondary | ICD-10-CM | POA: Insufficient documentation

## 2012-02-20 DIAGNOSIS — C342 Malignant neoplasm of middle lobe, bronchus or lung: Secondary | ICD-10-CM | POA: Insufficient documentation

## 2012-02-20 DIAGNOSIS — Z09 Encounter for follow-up examination after completed treatment for conditions other than malignant neoplasm: Secondary | ICD-10-CM | POA: Insufficient documentation

## 2012-02-20 DIAGNOSIS — J438 Other emphysema: Secondary | ICD-10-CM | POA: Insufficient documentation

## 2012-02-20 MED ORDER — IOHEXOL 300 MG/ML  SOLN
80.0000 mL | Freq: Once | INTRAMUSCULAR | Status: AC | PRN
  Administered 2012-02-20: 80 mL via INTRAVENOUS

## 2012-02-20 NOTE — Telephone Encounter (Signed)
Message copied by Sabino Snipes on Fri Feb 20, 2012  5:34 PM ------      Message from: Levert Feinstein      Created: Fri Feb 20, 2012  5:13 PM       Call pt - no evidence for recurrent lung ca

## 2012-02-20 NOTE — Telephone Encounter (Signed)
Pt notified of CT report per Dr. Cyndie Chime.  She expressed appreciation.

## 2012-04-17 ENCOUNTER — Other Ambulatory Visit: Payer: Self-pay

## 2012-08-11 ENCOUNTER — Telehealth: Payer: Self-pay | Admitting: Oncology

## 2012-08-11 NOTE — Telephone Encounter (Signed)
Pt has a stroke r/s appt with ML on 6/13 to August 15th lab and ML

## 2012-08-13 ENCOUNTER — Ambulatory Visit: Payer: Medicare Other | Admitting: Nurse Practitioner

## 2012-08-13 ENCOUNTER — Other Ambulatory Visit: Payer: Medicare Other | Admitting: Lab

## 2012-10-06 ENCOUNTER — Other Ambulatory Visit: Payer: Self-pay

## 2012-10-15 ENCOUNTER — Ambulatory Visit (HOSPITAL_BASED_OUTPATIENT_CLINIC_OR_DEPARTMENT_OTHER): Payer: Medicare Other | Admitting: Nurse Practitioner

## 2012-10-15 ENCOUNTER — Telehealth: Payer: Self-pay | Admitting: Oncology

## 2012-10-15 ENCOUNTER — Other Ambulatory Visit: Payer: Medicare Other | Admitting: Lab

## 2012-10-15 VITALS — BP 165/70 | HR 98 | Temp 98.3°F | Resp 20 | Ht 65.0 in | Wt 166.3 lb

## 2012-10-15 DIAGNOSIS — C342 Malignant neoplasm of middle lobe, bronchus or lung: Secondary | ICD-10-CM

## 2012-10-15 NOTE — Progress Notes (Signed)
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 She was treated with primary radiation 01/17/2010 through 01/29/2010 achieving a complete radiographic response. Most recent chest CT 02/20/2012 showed posttreatment changes in the right hemithorax without evidence of recurrent or metastatic disease.  She is seen today for scheduled followup. In March of this year she had a stroke. She woke up one morning with right-sided weakness and inability to swallow. She had a temporary feeding tube. She is now eating and drinking without difficulty. The right-sided weakness has markedly improved. She reports being diagnosed with pneumonia while in the hospital with the stroke.  Dyspnea is at baseline. She is on oxygen continuously at 2 L per minute. Stable chronic cough. She has a good appetite. She is gaining weight. She has pain associated with a right "frozen shoulder".   Objective: Blood pressure 165/70, pulse 98, temperature 98.3 F (36.8 C), temperature source Oral, resp. rate 20, height 5\' 5"  (1.651 m), weight 166 lb 4.8 oz (75.433 kg), SpO2 95.00%.  No thrush. No palpable cervical, supraclavicular or axillary lymph nodes. Lungs with scattered wheezes anterior greater than posterior. Regular cardiac rhythm. Abdomen is soft and nontender. No hepatomegaly. Wound at the left abdomen at the site of the previous feeding tube. Extremities are without edema. Motor strength 5 over 5.  Lab Results: Lab Results  Component Value Date   WBC 14.7* 02/13/2012   HGB 14.5 02/13/2012   HCT 43.4 02/13/2012   MCV 91.5 02/13/2012   PLT 211 02/13/2012    Chemistry:    Chemistry      Component Value Date/Time   NA 143 02/13/2012 1340   NA 144 08/08/2011 1344   NA 141 02/19/2011 1323   K 3.7 02/13/2012 1340   K 4.6 08/08/2011 1344   K 3.9 02/19/2011 1323   CL 103  02/13/2012 1340   CL 103 08/08/2011 1344   CL 105 02/19/2011 1323   CO2 30* 02/13/2012 1340   CO2 28 08/08/2011 1344   CO2 22 02/19/2011 1323   BUN 13.0 02/13/2012 1340   BUN 14 08/08/2011 1344   BUN 15 02/19/2011 1323   CREATININE 0.7 02/13/2012 1340   CREATININE 0.9 08/08/2011 1344   CREATININE 0.69 02/19/2011 1323      Component Value Date/Time   CALCIUM 9.7 02/13/2012 1340   CALCIUM 9.3 08/08/2011 1344   CALCIUM 9.8 02/19/2011 1323   ALKPHOS 69 02/13/2012 1340   ALKPHOS 66 08/08/2011 1344   ALKPHOS 67 02/19/2011 1323   AST 12 02/13/2012 1340   AST 21 08/08/2011 1344   AST 15 02/19/2011 1323   ALT 23 02/13/2012 1340   ALT 22 08/08/2011 1344   ALT 14 02/19/2011 1323   BILITOT 0.55 02/13/2012 1340   BILITOT 0.90 08/08/2011 1344   BILITOT 0.4 02/19/2011 1323       Studies/Results: No results found.  Medications: I have reviewed the patient's current medications.  Assessment/Plan:  1. Clinical stage I squamous cell carcinoma, right middle lobe, (T1b Nx M0) status post primary radiation 11/17 through 01/29/2010 with a complete response. Chest CT 02/20/2012 was without evidence of recurrent or metastatic disease. 2. Advanced obstructive airway disease, oxygen dependent.  3. Hypertension. 4. Type 2 diabetes. 5. Hyperlipidemia. 6. CVA March 2014 presenting with right-sided weakness and swallowing dysfunction.  Disposition-Ms. Moraes appears stable. She will return for a followup  visit in 6 months with labs and chest CT 1 week prior.  Plan reviewed with Dr. Thomes Cake, Misty Stanley ANP/GNP-BC

## 2012-10-15 NOTE — Telephone Encounter (Signed)
Gave pt appt for Ct after lab and MD on February 2014

## 2012-10-15 NOTE — Addendum Note (Signed)
Addended by: Sabino Snipes on: 10/15/2012 03:08 PM   Modules accepted: Orders, Medications

## 2013-01-06 ENCOUNTER — Other Ambulatory Visit: Payer: Self-pay

## 2013-04-08 ENCOUNTER — Encounter (HOSPITAL_COMMUNITY): Payer: Self-pay

## 2013-04-08 ENCOUNTER — Other Ambulatory Visit (HOSPITAL_BASED_OUTPATIENT_CLINIC_OR_DEPARTMENT_OTHER): Payer: Medicare Other

## 2013-04-08 ENCOUNTER — Ambulatory Visit (HOSPITAL_COMMUNITY)
Admission: RE | Admit: 2013-04-08 | Discharge: 2013-04-08 | Disposition: A | Discharge status: Home / Self Care | Payer: Medicare Other | Source: Ambulatory Visit | Attending: Nurse Practitioner | Admitting: Nurse Practitioner

## 2013-04-08 DIAGNOSIS — C342 Malignant neoplasm of middle lobe, bronchus or lung: Secondary | ICD-10-CM

## 2013-04-08 DIAGNOSIS — R222 Localized swelling, mass and lump, trunk: Secondary | ICD-10-CM | POA: Insufficient documentation

## 2013-04-08 DIAGNOSIS — C3490 Malignant neoplasm of unspecified part of unspecified bronchus or lung: Secondary | ICD-10-CM | POA: Insufficient documentation

## 2013-04-08 DIAGNOSIS — Z9089 Acquired absence of other organs: Secondary | ICD-10-CM | POA: Insufficient documentation

## 2013-04-08 LAB — COMPREHENSIVE METABOLIC PANEL (CC13)
ALT: 18 U/L (ref 0–55)
ANION GAP: 11 meq/L (ref 3–11)
AST: 17 U/L (ref 5–34)
Albumin: 3.6 g/dL (ref 3.5–5.0)
Alkaline Phosphatase: 91 U/L (ref 40–150)
BILIRUBIN TOTAL: 0.41 mg/dL (ref 0.20–1.20)
BUN: 10.8 mg/dL (ref 7.0–26.0)
CO2: 26 meq/L (ref 22–29)
CREATININE: 0.7 mg/dL (ref 0.6–1.1)
Calcium: 10.2 mg/dL (ref 8.4–10.4)
Chloride: 104 mEq/L (ref 98–109)
GLUCOSE: 111 mg/dL (ref 70–140)
Potassium: 4 mEq/L (ref 3.5–5.1)
Sodium: 141 mEq/L (ref 136–145)
Total Protein: 6.7 g/dL (ref 6.4–8.3)

## 2013-04-08 LAB — CBC WITH DIFFERENTIAL/PLATELET
BASO%: 0.7 % (ref 0.0–2.0)
Basophils Absolute: 0.1 10*3/uL (ref 0.0–0.1)
EOS ABS: 0.1 10*3/uL (ref 0.0–0.5)
EOS%: 1.2 % (ref 0.0–7.0)
HEMATOCRIT: 37.7 % (ref 34.8–46.6)
HGB: 12.7 g/dL (ref 11.6–15.9)
LYMPH%: 15.8 % (ref 14.0–49.7)
MCH: 29.3 pg (ref 25.1–34.0)
MCHC: 33.6 g/dL (ref 31.5–36.0)
MCV: 87.3 fL (ref 79.5–101.0)
MONO#: 0.6 10*3/uL (ref 0.1–0.9)
MONO%: 6.1 % (ref 0.0–14.0)
NEUT%: 76.2 % (ref 38.4–76.8)
NEUTROS ABS: 8.1 10*3/uL — AB (ref 1.5–6.5)
PLATELETS: 385 10*3/uL (ref 145–400)
RBC: 4.32 10*6/uL (ref 3.70–5.45)
RDW: 14.4 % (ref 11.2–14.5)
WBC: 10.7 10*3/uL — AB (ref 3.9–10.3)
lymph#: 1.7 10*3/uL (ref 0.9–3.3)

## 2013-04-08 LAB — LACTATE DEHYDROGENASE (CC13): LDH: 163 U/L (ref 125–245)

## 2013-04-08 MED ORDER — IOHEXOL 300 MG/ML  SOLN
80.0000 mL | Freq: Once | INTRAMUSCULAR | Status: AC | PRN
  Administered 2013-04-08: 80 mL via INTRAVENOUS

## 2013-04-15 ENCOUNTER — Telehealth: Payer: Self-pay | Admitting: Oncology

## 2013-04-15 ENCOUNTER — Ambulatory Visit (HOSPITAL_BASED_OUTPATIENT_CLINIC_OR_DEPARTMENT_OTHER): Payer: Medicare Other | Admitting: Oncology

## 2013-04-15 VITALS — BP 138/65 | HR 108 | Temp 97.4°F | Resp 17 | Ht 65.0 in | Wt 178.5 lb

## 2013-04-15 DIAGNOSIS — C342 Malignant neoplasm of middle lobe, bronchus or lung: Secondary | ICD-10-CM

## 2013-04-15 MED ORDER — AMOXICILLIN-POT CLAVULANATE 875-125 MG PO TABS: 1.0000 | ORAL_TABLET | Freq: Two times a day (BID) | ORAL | Status: AC

## 2013-04-15 MED ORDER — AMOXICILLIN-POT CLAVULANATE 875-125 MG PO TABS: 1.0000 | ORAL_TABLET | Freq: Two times a day (BID) | ORAL | Status: DC

## 2013-04-15 NOTE — Telephone Encounter (Signed)
Gave pt appt for md visit for March 2015

## 2013-04-15 NOTE — Progress Notes (Signed)
Hematology and Oncology Follow Up Visit  Teresa Anthony [id number] 1945-03-10 68 y.o. 04/15/2013 6:48 PM   Principle Diagnosis: Encounter Diagnosis  Name Primary?  . 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 She was treated with primary radiation. Between November 17 and 01/29/2010. She achieved a complete radiographic response.  She has had recurrent episodes of bronchitis. She had a another episode beginning about a month ago.  Unfortunately, the CT scan done in this patient today's visit done on 01/06/2014 which I personally reviewed, compared with a CT scan done 02/20/2012, shows a new left perihilar lung mass at the level of the carina measuring 2.1 x 2.7 cm. There is associated mild postobstructive pneumonitis. No obvious mediastinal lymphadenopathy. Fibrotic changes in the right lung secondary to prior radiation. The liver and adrenals appear okay. Bones okay.  Although she has gained weight compared with her visit here in August 2014, I think most of this is fluid wave from repetitive courses of steroids. She appears chronically ill. She needs to use a wheelchair. She is on continuous oxygen.   Medications: reviewed  Allergies:  Allergies  Allergen Reactions  . Codeine Itching and Rash    Diffuse, severe rash    Review of Systems: Hematology:  No bleeding ENT ROS: No sore throat Breast ROS:  Respiratory ROS: Persistent cough no fever Cardiovascular ROS:  No ischemic type chest pain or palpitations Gastrointestinal ROS: No abdominal pain or change in bowel habit    Genito-Urinary ROS: Not questioned  Musculoskeletal ROS: No new bone pain Neurological ROS: No headache or change in vision  Dermatological ROS: No rash  Remaining ROS negative:    Physical Exam: Blood pressure 138/65, pulse 108, temperature 97.4 F (36.3 C), temperature source Oral, resp. rate 17, height 5' 5"  (1.651 m), weight 178 lb 8 oz (80.967 kg), SpO2 94.00%, peak flow 2 L/min. Wt Readings from Last 3 Encounters:  04/15/13 178 lb 8 oz (80.967 kg)  10/15/12 166 lb 4.8 oz (75.433 kg)  02/13/12 164 lb 9.6 oz (74.662 kg)     General appearance: Chronically ill-appearing Caucasian woman wearing oxygen. HENNT: Cushingoid face. Pharynx no erythema, exudate, mass, or ulcer. No thyromegaly or thyroid nodules Lymph nodes: No cervical, supraclavicular, or axillary lymphadenopathy Breasts:  Lungs: Diffuse inspiratory and expiratory wheezes throughout both lungs more prominent over the left hemithorax. Heart: Regular rhythm, no murmur, no gallop, no rub, no click, no edema Abdomen: Soft, nontender, normal bowel sounds, no mass, no organomegaly Extremities: No edema, no calf tenderness Musculoskeletal: no joint deformities GU:  Vascular: Carotid pulses 2+, no bruits,  Neurologic: Alert, oriented, PERRLA,  cranial nerves grossly normal, motor strength 5 over 5, reflexes 1+ symmetric, upper body coordination normal, gait normal, Skin: No rash   Lab Results: CBC W/Diff    Component Value Date/Time   WBC 10.7* 04/08/2013 1419   RBC 4.32 04/08/2013 1419   HGB 12.7 04/08/2013 1419   HCT 37.7 04/08/2013 1419   PLT 385 04/08/2013 1419   MCV 87.3 04/08/2013 1419   MCH 29.3 04/08/2013 1419   MCHC 33.6 04/08/2013 1419   RDW 14.4 04/08/2013 1419   LYMPHSABS 1.7 04/08/2013 1419   MONOABS 0.6 04/08/2013 1419   EOSABS 0.1 04/08/2013 1419   BASOSABS 0.1 04/08/2013 1419  Chemistry      Component Value Date/Time   NA 141 04/08/2013 1419   NA 144 08/08/2011 1344   NA 141 02/19/2011 1323   K 4.0 04/08/2013 1419   K 4.6 08/08/2011 1344   K 3.9 02/19/2011 1323   CL 103 02/13/2012 1340   CL 103 08/08/2011 1344   CL 105 02/19/2011 1323   CO2 26 04/08/2013 1419   CO2 28 08/08/2011 1344   CO2 22  02/19/2011 1323   BUN 10.8 04/08/2013 1419   BUN 14 08/08/2011 1344   BUN 15 02/19/2011 1323   CREATININE 0.7 04/08/2013 1419   CREATININE 0.9 08/08/2011 1344   CREATININE 0.69 02/19/2011 1323      Component Value Date/Time   CALCIUM 10.2 04/08/2013 1419   CALCIUM 9.3 08/08/2011 1344   CALCIUM 9.8 02/19/2011 1323   ALKPHOS 91 04/08/2013 1419   ALKPHOS 66 08/08/2011 1344   ALKPHOS 67 02/19/2011 1323   AST 17 04/08/2013 1419   AST 21 08/08/2011 1344   AST 15 02/19/2011 1323   ALT 18 04/08/2013 1419   ALT 22 08/08/2011 1344   ALT 14 02/19/2011 1323   BILITOT 0.41 04/08/2013 1419   BILITOT 0.90 08/08/2011 1344   BILITOT 0.4 02/19/2011 1323       Radiological Studies: Ct Chest W Contrast  04/08/2013   CLINICAL DATA:  Evaluate lung cancer  EXAM: CT CHEST WITH CONTRAST  TECHNIQUE: Multidetector CT imaging of the chest was performed during intravenous contrast administration.  CONTRAST:  29m OMNIPAQUE IOHEXOL 300 MG/ML  SOLN  COMPARISON:  02/20/2012  FINDINGS: There is no pleural effusion. There are moderate changes of centrilobular emphysema. Fibrotic changes are identified within the periphery of the right midlung and right base, image 35/series 5. New perihilar lung mass measures 2.1 x 2.7 cm, image 73/series 602. There is mild postobstructive pneumonitis involving the superior segment of the left upper lobe, image 84/series 602. No mediastinal adenopathy or contralateral hilar adenopathy noted. Incidental imaging through the upper abdomen shows changes of prior cholecystectomy. The adrenal glands are both within normal limits.  Review of the visualized osseous structures is on unremarkable. There is no axillary or supraclavicular adenopathy.  IMPRESSION: 1. New left perihilar mass is identified. This is concerning for either recurrence of tumor or a new primary. Recommend further evaluation with PET-CT and tissue sampling.   Electronically Signed   By: TKerby MoorsM.D.   On: 04/08/2013 16:27    Impression:   Likely second primary non-small cell lung cancer in a lady with endstage, oxygen dependent, obstructive airway disease. Performance status has declined significantly over the last year. I doubt that she will be a acceptable candidate for additional radiation but I will ask Dr. MValere Drossto review her films. The only other option that we have is chemotherapy and I would be concerned that she would not tolerate this well. I think that if I did offer her chemotherapy I would use either single agent Navelbine or single agent carboplatinum. If she is a candidate for further radiation, I will get a PET scan. It is difficult for her to lie flat and also difficult for her to raise her left arm over her head due to a rotator cuff problem with her left shoulder. If she is not a radiation candidate, I will not get a PET scan but offer her limited chemotherapy. I discussed this with her and her husband today. Face-to-face contact with patient and husband to review data, x-ray images,  and formulate a treatment strategy approximately 40 minutes.  She and her husband live in [place]. He is also under treatment here for multiple myeloma. Once I have a more definite treatment plan, I would like to transition their care to Dr. Marin Olp.   CC: Patient Care Team: Shellia Carwin as PCP - General (Physician Assistant)   Annia Belt, MD 2/13/20156:48 PM

## 2013-04-22 ENCOUNTER — Telehealth: Payer: Self-pay | Admitting: Oncology

## 2013-04-22 ENCOUNTER — Other Ambulatory Visit: Payer: Self-pay | Admitting: Oncology

## 2013-04-22 DIAGNOSIS — C3430 Malignant neoplasm of lower lobe, unspecified bronchus or lung: Secondary | ICD-10-CM

## 2013-04-22 NOTE — Telephone Encounter (Signed)
S/w the pt and she is aware of her appt with dr Valere Dross in rad onc on 04/26/2013

## 2013-04-22 NOTE — Telephone Encounter (Signed)
X °

## 2013-04-25 ENCOUNTER — Encounter: Payer: Self-pay | Admitting: Radiation Oncology

## 2013-04-25 DIAGNOSIS — C3490 Malignant neoplasm of unspecified part of unspecified bronchus or lung: Secondary | ICD-10-CM | POA: Insufficient documentation

## 2013-04-25 DIAGNOSIS — Z923 Personal history of irradiation: Secondary | ICD-10-CM | POA: Insufficient documentation

## 2013-04-25 NOTE — Progress Notes (Signed)
Thoracic Location of Tumor / Histology:  RML squamous cell carcinoma, 1st dx 2011. Likely second primary NS cell cancer - left perihilar lung mass, at level of carina  Patient presented 1 months ago with symptoms of: recurrent bronchitis   Biopsies of  (if applicable) revealed: no biopsy  Tobacco/Marijuana/Snuff/ETOH use: history of 100 pk-year cigarette smoking , stopped 2011, no alcohol use  Past/Anticipated interventions by cardiothoracic surgery, if any: none  Past/Anticipated interventions by medical oncology, if any: pt has FU w/Dr Marin Olp for care  Signs/Symptoms  Weight changes, if any:   Respiratory complaints, if any: O2 dependent COPD, persistent cough  Hemoptysis, if any:   Pain issues, if any:    SAFETY ISSUES:  Prior radiation? Yes, 11/17, 11/19, 11/21, 11/23, 01/29/2010. SBRT RML lung primary 6000 cGy 5 fractions  Pacemaker/ICD? no  Possible current pregnancy? no  Is the patient on methotrexate? no  Current Complaints / other details:  04/08/13  CT Chest: new left perihilar mass

## 2013-04-26 ENCOUNTER — Ambulatory Visit: Payer: Medicare Other

## 2013-04-26 ENCOUNTER — Ambulatory Visit
Admission: RE | Admit: 2013-04-26 | Discharge: 2013-04-26 | Disposition: A | Discharge status: Home / Self Care | Payer: Medicare Other | Source: Ambulatory Visit | Attending: Radiation Oncology | Admitting: Radiation Oncology

## 2013-04-26 HISTORY — DX: Gastro-esophageal reflux disease without esophagitis: K21.9

## 2013-04-26 HISTORY — DX: Personal history of irradiation: Z92.3

## 2013-04-29 ENCOUNTER — Ambulatory Visit: Payer: Medicare Other | Admitting: Oncology

## 2013-04-30 ENCOUNTER — Encounter: Payer: Self-pay | Admitting: Hematology & Oncology

## 2013-04-30 ENCOUNTER — Telehealth: Payer: Self-pay | Admitting: Hematology & Oncology

## 2013-04-30 NOTE — Telephone Encounter (Signed)
, °

## 2013-05-01 ENCOUNTER — Encounter: Payer: Self-pay | Admitting: Oncology

## 2013-05-01 NOTE — Progress Notes (Signed)
Pleasant 68 year old woman, oxygen dependent endstage obstructive airway disease , status post therapeutic dose radiation for a clinical stage I squamous cell carcinoma right middle lobe October 2011. Recent reevaluation shows a second primary 2 x 3 cm left perihilar mass. I asked radiation oncology to review her x-rays to see if she would be a candidate for additional radiation. I was not optimistic given her advanced lung disease. However, with the new radiation techniques, she was felt to be a potential candidate for additional treatment. I arranged a followup consultation with Dr. Arloa Koh. I will be scheduled today's visit with medical oncology pending completion of additional radiation treatments.  I'm going to transition her care to Dr. Marin Olp.

## 2013-05-03 ENCOUNTER — Telehealth: Payer: Self-pay | Admitting: *Deleted

## 2013-05-03 ENCOUNTER — Encounter: Payer: Self-pay | Admitting: Radiation Oncology

## 2013-05-03 ENCOUNTER — Ambulatory Visit
Admission: RE | Admit: 2013-05-03 | Discharge: 2013-05-03 | Disposition: A | Discharge status: Home / Self Care | Payer: Medicare Other | Source: Ambulatory Visit | Attending: Radiation Oncology | Admitting: Radiation Oncology

## 2013-05-03 VITALS — BP 150/80 | HR 98 | Temp 97.9°F | Resp 20 | Wt 180.0 lb

## 2013-05-03 DIAGNOSIS — C3490 Malignant neoplasm of unspecified part of unspecified bronchus or lung: Secondary | ICD-10-CM | POA: Insufficient documentation

## 2013-05-03 DIAGNOSIS — C3492 Malignant neoplasm of unspecified part of left bronchus or lung: Secondary | ICD-10-CM

## 2013-05-03 NOTE — Telephone Encounter (Signed)
CALLED PATIENT TO INFORM OF TEST AND [facility]C VISIT, SPOKE WITH PATIENT AND SHE IS AWARE OF THESE APPTS.

## 2013-05-03 NOTE — Progress Notes (Signed)
Thoracic Location of Tumor / Histology: RML squamous cell carcinoma, 1st dx 2011. Likely second primary NS cell cancer - left perihilar lung mass, at level of carina   Patient presented 1 months ago with symptoms of: recurrent bronchitis   Biopsies of (if applicable) revealed: no biopsy   Tobacco/Marijuana/Snuff/ETOH use: history of 100 pk-year cigarette smoking , stopped 2011, no alcohol use   Past/Anticipated interventions by cardiothoracic surgery, if any: none   Past/Anticipated interventions by medical oncology, if any: pt has FU w/Dr Marin Olp for care   Signs/Symptoms  Weight changes, if any: slight weight gain recently Respiratory complaints, if any: O2 dependent COPD, persistent cough  Hemoptysis, if any: no, productive cough w/clear to white sputum Pain issues, if any: no  SAFETY ISSUES:  Prior radiation? Yes, 11/17, 11/19, 11/21, 11/23, 01/29/2010. SBRT RML lung primary 6000 cGy 5 fractions  Pacemaker/ICD? no  Possible current pregnancy? no  Is the patient on methotrexate? No  Current Complaints / other details: Pt denies pain, states appetite comes and goes. She states she has difficulty breathing with any activity at all. O2 dependent 2 L/min.   04/08/13 CT Chest: new left perihilar mass

## 2013-05-03 NOTE — Progress Notes (Signed)
[facility] Follow up Note  Name: Jasmyn Picha   Date:   05/03/2013 MRN:  [id number] DOB: 10/14/1945   CC:  Shellia Carwin, PA-C  Annia Belt, MD, Dr. Burney Gauze  DIAGNOSIS: Clinical stage T2 N0 suspected non-small cell carcinoma of the left lung    INTERVAL SINCE LAST RADIATION: Previous radiation therapy (radiosurgery) to the peripheral right middle lobe completed November 2011    ALLERGIES: Codeine   MEDICATIONS:  Current Outpatient Prescriptions  Medication Sig Dispense Refill  . ADVAIR DISKUS 500-50 MCG/DOSE AEPB 1 puff 2 (two) times daily.       Marland Kitchen ALPRAZolam (XANAX) 0.25 MG tablet Take 1 tablet by mouth at bedtime as needed. 1/2 -1 tab at night prn sleep      . amLODipine (NORVASC) 2.5 MG tablet Take 5 mg by mouth daily.       Marland Kitchen aspirin 325 MG tablet Take 325 mg by mouth daily.      Marland Kitchen buPROPion (WELLBUTRIN) 75 MG tablet Take 1 tablet by mouth 2 (two) times daily.      . Calcium Carbonate-Vitamin D (CALCIUM 500 + D) 500-125 MG-UNIT TABS Take 1 tablet by mouth 2 (two) times daily.      . CRESTOR 5 MG tablet Take 5 mg by mouth daily.       . famotidine (PEPCID) 20 MG tablet Take 20 mg by mouth daily.      Marland Kitchen guaiFENesin (MUCINEX) 600 MG 12 hr tablet Take 600 mg by mouth 2 (two) times daily.      . Ipratropium-Albuterol (COMBIVENT RESPIMAT) 20-100 MCG/ACT AERS respimat Inhale 1 puff into the lungs every 6 (six) hours.      Marland Kitchen ipratropium-albuterol (DUONEB) 0.5-2.5 (3) MG/3ML SOLN Take 3 mLs by nebulization every 6 (six) hours as needed.      Marland Kitchen KLOR-CON M10 10 MEQ tablet Take 10 mEq by mouth daily.       Marland Kitchen latanoprost (XALATAN) 0.005 % ophthalmic solution Place 1 drop into both eyes at bedtime.       Marland Kitchen loratadine (CLARITIN) 10 MG tablet Take 10 mg by mouth daily.      . metFORMIN (GLUCOPHAGE) 500 MG tablet 500 mg 2 (two) times daily with a meal.       . montelukast (SINGULAIR) 10 MG tablet Take 10 mg by mouth at bedtime.       .  pantoprazole (PROTONIX) 40 MG tablet Take 1 tablet by mouth 2 (two) times daily.      Marland Kitchen telmisartan (MICARDIS) 80 MG tablet Take 80 mg by mouth daily.        No current facility-administered medications for this encounter.     NARRATIVE: Ms. Glymph is a pleasant 68 year old female who is seen today to request a Dr. Beryle Beams for evaluation of her suspected new left lung primary carcinoma. A CT scan of the chest on 04/08/2013 showed a new left perihilar mass concerning for a new primary. This measured 2.1 x 2.7 cm, and there is mild postobstructive pneumonitis involving this appears segment of the left upper lobe. Her previously treated peripheral right middle lobe area shows fibrotic changes without evidence for recurrent disease. She continues to have dyspnea is on nasal O2.   PHYSICAL EXAM:   weight is 180 lb (81.647 kg). Her oral temperature is 97.9 F (36.6 C). Her blood pressure is 150/80 and her pulse is 98. Her respiration is 20 and oxygen saturation is 93%.  BP 150/80  Pulse 98  Temp(Src) 97.9 F (36.6 C) (Oral)  Resp 20  Wt 180 lb (81.647 kg)  SpO2 93%  Head and neck examination: She is on nasal O2. Nodes: There is no palpable cervical or supraclavicular lymphadenopathy. Chest: Breath sounds are distant with scattered wheezes throughout both lung zones. Abdomen: Without hepatomegaly. 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       LABORATORY DATA:  Lab Results  Component Value Date   WBC 10.7* 04/08/2013   HGB 12.7 04/08/2013   HCT 37.7 04/08/2013   MCV 87.3 04/08/2013   PLT 385 04/08/2013   Lab Results  Component Value Date   NA 141 04/08/2013   K 4.0 04/08/2013   CL 103 02/13/2012   CO2 26 04/08/2013   Lab Results  Component Value Date   ALT 18 04/08/2013   AST 17 04/08/2013   ALKPHOS 91 04/08/2013   BILITOT 0.41 04/08/2013      IMPRESSION: Clinical stage T2 N0 MX suspected non-small cell carcinoma involving the left hilum. I suspect  that this represents a new lung primary. Ideally, we would prefer a tissue diagnosis prior to radiation therapy, but bronchoscopy/biopsy may place her at some risk because of her poor respiratory reserve. She would like to avoid biopsy if all possible. I would like to obtain a PET scan to see she has limited disease which may be amenable to radiation therapy. Management options include continued observation, 2 weeks of stereotactic-like radiosurgery with curative intent, or 2 weeks of palliative radiation therapy to hopefully prevent further postobstructive atelectasis. She is not a candidate for traditional stereotactic radiosurgery because of the proximity of her major bronchi and vessels. I discussed the potential acute and late toxicities of radiation therapy. I will go ahead and get a PET scan and then see her back for a brief followup visit to discuss the possible radiation therapy. It is my understanding that Dr. Burney Gauze will assume her care from Dr. Beryle Beams.    PLAN: As discussed above. Followup visit to see me after her PET scan.   I spent 30 minutes minutes face to face with the patient and more than 50% of that time was spent in counseling and/or coordination of care.

## 2013-05-03 NOTE — Progress Notes (Signed)
Please see the Nurse Progress Note in the MD Initial Consult Encounter for this patient. 

## 2013-05-05 ENCOUNTER — Telehealth: Payer: Self-pay | Admitting: Hematology & Oncology

## 2013-05-05 ENCOUNTER — Telehealth: Payer: Self-pay | Admitting: *Deleted

## 2013-05-05 NOTE — Telephone Encounter (Signed)
Per RN moved 3-10 to 3-17 pt aware

## 2013-05-05 NOTE — Telephone Encounter (Signed)
Spoke with pt's husband earlier who asked if pt could be seen same time as his visit tomorrow.  Reviewed with Dr Beryle Beams & notified pt that we will try to r/s appt with Dr Marin Olp & the [facility] office should call her.  She can certainly come with Mr Harju tomorrow but this would not be a formal visit.  She expressed understanding.

## 2013-05-10 ENCOUNTER — Ambulatory Visit: Payer: Medicare Other | Admitting: Hematology & Oncology

## 2013-05-11 ENCOUNTER — Encounter (HOSPITAL_COMMUNITY)
Admission: RE | Admit: 2013-05-11 | Discharge: 2013-05-11 | Disposition: A | Discharge status: Home / Self Care | Payer: Medicare Other | Source: Ambulatory Visit | Attending: Radiation Oncology | Admitting: Radiation Oncology

## 2013-05-11 DIAGNOSIS — C3490 Malignant neoplasm of unspecified part of unspecified bronchus or lung: Secondary | ICD-10-CM | POA: Insufficient documentation

## 2013-05-11 DIAGNOSIS — C3492 Malignant neoplasm of unspecified part of left bronchus or lung: Secondary | ICD-10-CM

## 2013-05-11 LAB — GLUCOSE, CAPILLARY: Glucose-Capillary: 138 mg/dL — ABNORMAL HIGH (ref 70–99)

## 2013-05-11 MED ORDER — FLUDEOXYGLUCOSE F - 18 (FDG) INJECTION
10.0000 | Freq: Once | INTRAVENOUS | Status: AC | PRN
  Administered 2013-05-11: 10 via INTRAVENOUS

## 2013-05-12 ENCOUNTER — Ambulatory Visit
Admission: RE | Admit: 2013-05-12 | Discharge: 2013-05-12 | Disposition: A | Discharge status: Home / Self Care | Payer: Medicare Other | Source: Ambulatory Visit | Attending: Radiation Oncology | Admitting: Radiation Oncology

## 2013-05-12 ENCOUNTER — Encounter: Payer: Self-pay | Admitting: Radiation Oncology

## 2013-05-12 VITALS — BP 130/79 | HR 90 | Temp 98.3°F | Resp 20 | Wt 180.5 lb

## 2013-05-12 DIAGNOSIS — C3490 Malignant neoplasm of unspecified part of unspecified bronchus or lung: Secondary | ICD-10-CM | POA: Insufficient documentation

## 2013-05-12 DIAGNOSIS — C3492 Malignant neoplasm of unspecified part of left bronchus or lung: Secondary | ICD-10-CM

## 2013-05-12 DIAGNOSIS — C342 Malignant neoplasm of middle lobe, bronchus or lung: Secondary | ICD-10-CM

## 2013-05-12 NOTE — Progress Notes (Signed)
Please see the Nurse Progress Note in the MD Initial Consult Encounter for this patient. 

## 2013-05-12 NOTE — Progress Notes (Signed)
Thoracic Location of Tumor / Histology: RML squamous cell carcinoma, 1st dx 2011. Likely second primary NS cell cancer - left perihilar lung mass, at level of carina   Patient presented 1 months ago with symptoms of: recurrent bronchitis   Biopsies of (if applicable) revealed: no biopsy  Tobacco/Marijuana/Snuff/ETOH use: history of 100 pk-year cigarette smoking , stopped 2011, no alcohol use   Past/Anticipated interventions by cardiothoracic surgery, if any: none  Past/Anticipated interventions by medical oncology, if any: pt has FU w/Dr Marin Olp for care   Signs/Symptoms  Weight changes, if any: slight weight gain recently  Respiratory complaints, if any: O2 dependent COPD, persistent cough  Hemoptysis, if any: no, dry cough  Pain issues, if any: no   SAFETY ISSUES:  Prior radiation? Yes, 11/17, 11/19, 11/21, 11/23, 01/29/2010. SBRT RML lung primary 6000 cGy 5 fractions  Pacemaker/ICD? no  Possible current pregnancy? no  Is the patient on methotrexate? No   Current Complaints / other details: Pt denies pain, states appetite comes and goes. She states she has difficulty breathing with any activity at all. O2 dependent 2 L/min.   04/08/13 CT Chest: new left perihilar mass Pt here to review 05/11/13 PET scan results.

## 2013-05-12 NOTE — Progress Notes (Signed)
CC: Dr. Murriel Hopper, Dr. Burney Gauze, Fredric Mare, PA  Followup note:  Ms. Beman visits today after her PET scan for staging of her suspected T2 N0 non-small cell carcinoma of the left lung. Her PET scan on 05/11/2013 showed a hypermetabolic mass posterior to the origin of the left lower lobe bronchus felt to represent a primary carcinoma. Maximum SUV was 14.5 with the mass measuring 2.4 cm. Of note is that her previously treated right peripheral lung primary shows a SUV of 2.9 with traumatic changes felt to be secondary to previous radiosurgery. She is without new complaints today.  Physical examination: Alert and oriented. She is on nasal O2. Filed Vitals:   05/12/13 1338  BP: 130/79  Pulse: 90  Temp: 98.3 F (36.8 C)  Resp: 20   She is not examined today.  Impression: T2 N0 suspected non-small cell carcinoma of the left lung. We again discussed her management options. I feel that she would be best served by stereotactic radiosurgery over a period of 2 weeks delivering 5000 cGy in 10 fractions. We discussed the potential acute and late toxicities of radiation therapy including worsening dyspnea and even respiratory failure. He'll also be a small risk for damage to the bronchi and major vessels. She accepts these risks and consent is signed today. Of note is that the case was discussed with Dr. Kyung Rudd is in agreement with this stereotactic radiosurgery fractionation.  Plan: She'll return early next week for simulation/treatment planning.  15 minutes was spent face-to-face with the patient, counseling the patient and coordinating her care.

## 2013-05-13 ENCOUNTER — Telehealth: Payer: Self-pay | Admitting: Hematology & Oncology

## 2013-05-13 NOTE — Progress Notes (Signed)
[facility] Psychosocial Distress Screening Clinical Social Work  Clinical Social Work was referred by distress screening protocol.  The patient scored a 8 on the Psychosocial Distress Thermometer which indicates severe distress. Clinical Social Worker Intern telephoned to assess for distress and other psychosocial needs. Patient stated everything was fine.  CSWI shared resources and programs available.  Patient agrees to contact [facility] if further services are needed.   Clinical Social Worker follow up needed: no  If yes, follow up plan:   Sharvil Hoey S. [place] Work Intern [employer] [phone]

## 2013-05-13 NOTE — Telephone Encounter (Signed)
Pt aware of 3-17 time change

## 2013-05-16 ENCOUNTER — Ambulatory Visit
Admission: RE | Admit: 2013-05-16 | Discharge: 2013-05-16 | Disposition: A | Discharge status: Home / Self Care | Payer: Medicare Other | Source: Ambulatory Visit | Attending: Radiation Oncology | Admitting: Radiation Oncology

## 2013-05-16 DIAGNOSIS — C342 Malignant neoplasm of middle lobe, bronchus or lung: Secondary | ICD-10-CM | POA: Insufficient documentation

## 2013-05-16 DIAGNOSIS — Z510 Encounter for antineoplastic radiation therapy: Secondary | ICD-10-CM | POA: Insufficient documentation

## 2013-05-16 DIAGNOSIS — C3492 Malignant neoplasm of unspecified part of left bronchus or lung: Secondary | ICD-10-CM

## 2013-05-16 NOTE — Progress Notes (Signed)
Complex simulation/treatment planning note: The patient was taken to the CT simulator and placed supine. A body fix immobilization device was constructed. Abdominal compression was applied. She was then scanned.  Be diagnostic CT scan was fused with the CT simulation CT scan for more accurate contouring of her ITV. Her PET scan was closely reviewed as well. The ITV was contoured in the MIP projections, and with an expanded by 0.5 cm. I'm prescribing 5000 cGy in 10 sessions utilizing 6 MV photons. She is now ready for 3-D simulation.

## 2013-05-17 ENCOUNTER — Encounter: Payer: Self-pay | Admitting: Radiation Oncology

## 2013-05-17 ENCOUNTER — Encounter: Payer: Self-pay | Admitting: Hematology & Oncology

## 2013-05-17 ENCOUNTER — Other Ambulatory Visit: Payer: Self-pay | Admitting: Radiation Oncology

## 2013-05-17 ENCOUNTER — Ambulatory Visit (HOSPITAL_BASED_OUTPATIENT_CLINIC_OR_DEPARTMENT_OTHER): Payer: Medicare Other | Admitting: Hematology & Oncology

## 2013-05-17 VITALS — BP 149/66 | HR 95 | Temp 98.0°F | Resp 20 | Wt 180.0 lb

## 2013-05-17 DIAGNOSIS — Z85118 Personal history of other malignant neoplasm of bronchus and lung: Secondary | ICD-10-CM

## 2013-05-17 DIAGNOSIS — M7989 Other specified soft tissue disorders: Secondary | ICD-10-CM

## 2013-05-17 DIAGNOSIS — J449 Chronic obstructive pulmonary disease, unspecified: Secondary | ICD-10-CM

## 2013-05-17 DIAGNOSIS — C3490 Malignant neoplasm of unspecified part of unspecified bronchus or lung: Secondary | ICD-10-CM

## 2013-05-17 DIAGNOSIS — J4489 Other specified chronic obstructive pulmonary disease: Secondary | ICD-10-CM

## 2013-05-17 DIAGNOSIS — Z9981 Dependence on supplemental oxygen: Secondary | ICD-10-CM

## 2013-05-17 DIAGNOSIS — J439 Emphysema, unspecified: Secondary | ICD-10-CM

## 2013-05-17 MED ORDER — PREDNISONE 20 MG PO TABS: ORAL_TABLET | ORAL | Status: DC

## 2013-05-17 NOTE — Progress Notes (Signed)
  Radiation Oncology         [phone] ________________________________  Name: Teresa Anthony MRN: [id number]  Date: 05/17/2013  DOB: Nov 26, 1945  RESPIRATORY MOTION MANAGEMENT SIMULATION  NARRATIVE:  In order to account for effect of respiratory motion on target structures and other organs in the planning and delivery of radiotherapy, this patient underwent respiratory motion management simulation.  To accomplish this, when the patient was brought to the CT simulation planning suite, 4D respiratoy motion management CT images were obtained.  The CT images were loaded into the planning software.  Then, using a variety of tools including Cine, MIP, and standard views, the target volume and planning target volumes (PTV) were delineated.  Avoidance structures were contoured.  Treatment planning then occurred.  Dose volume histograms were generated and reviewed for each of the requested structure.  The resulting plan was carefully reviewed and approved today.

## 2013-05-17 NOTE — Progress Notes (Signed)
3-D simulation note: The patient completed 3-D simulation in the management of her non-small cell carcinoma of the left lung. She was treated with 2 modulated arcs representing 2 complex treatment devices. Dose volume histograms were obtained for the target structures and also avoidance structures including the aorta, bronchi, lungs, and spinal cord. We met our departmental goals, guided by the RTOG 0813 protocol. I prescribing 5000 cGy in 10 sessions utilizing 6 MV photons. I requesting daily cone beam CT for image guidance.

## 2013-05-18 ENCOUNTER — Other Ambulatory Visit (HOSPITAL_BASED_OUTPATIENT_CLINIC_OR_DEPARTMENT_OTHER): Payer: Medicare Other

## 2013-05-18 ENCOUNTER — Ambulatory Visit (HOSPITAL_BASED_OUTPATIENT_CLINIC_OR_DEPARTMENT_OTHER)
Admission: RE | Admit: 2013-05-18 | Discharge: 2013-05-18 | Disposition: A | Discharge status: Home / Self Care | Payer: Medicare Other | Source: Ambulatory Visit | Attending: Hematology & Oncology | Admitting: Hematology & Oncology

## 2013-05-18 DIAGNOSIS — M7989 Other specified soft tissue disorders: Secondary | ICD-10-CM

## 2013-05-18 DIAGNOSIS — R609 Edema, unspecified: Secondary | ICD-10-CM | POA: Insufficient documentation

## 2013-05-19 ENCOUNTER — Encounter: Payer: Self-pay | Admitting: *Deleted

## 2013-05-20 ENCOUNTER — Ambulatory Visit
Admission: RE | Admit: 2013-05-20 | Discharge: 2013-05-20 | Disposition: A | Discharge status: Home / Self Care | Payer: Medicare Other | Source: Ambulatory Visit | Attending: Radiation Oncology | Admitting: Radiation Oncology

## 2013-05-20 NOTE — Progress Notes (Signed)
Hematology and Oncology Follow Up Visit  Teresa Anthony [id number] 08-17-1945 68 y.o. 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  Anthony is in for her first office visit. She is followed by Dr. Beryle Beams. Upon his retirement, and she is now seen [place]. She has a new bronchogenic carcinoma. This has not been biopsied because of her severe lung disease and the high likelihood of this being malignant on PET scan.  She's getting stereotactic radiosurgery. I think she'll start this soon.  She's oxygen-depended. She is on nebulizes. She's having some dyspnea today. I'll put her on some prednisone to see if this helps.  She's had no bleeding. He's had no nausea vomiting.  Medications: Current outpatient prescriptions:ADVAIR DISKUS 500-50 MCG/DOSE AEPB, 1 puff 2 (two) times daily. , Disp: , Rfl: ;  ALPRAZolam (XANAX) 0.25 MG tablet, Take 1 tablet by mouth at bedtime as needed. 1/2 -1 tab at night prn sleep, Disp: , Rfl: ;  amLODipine (NORVASC) 2.5 MG tablet, Take 5 mg by mouth daily. , Disp: , Rfl: ;  aspirin 325 MG tablet, Take 325 mg by mouth daily., Disp: , Rfl:  buPROPion (WELLBUTRIN) 75 MG tablet, Take 1 tablet by mouth 2 (two) times daily., Disp: , Rfl: ;  Calcium Carbonate-Vitamin D (CALCIUM 500 + D) 500-125 MG-UNIT TABS, Take 1 tablet by mouth 2 (two) times daily., Disp: , Rfl: ;  CRESTOR 5 MG tablet, Take 5 mg by mouth daily. , Disp: , Rfl: ;  famotidine (PEPCID) 20 MG tablet, Take 20 mg by mouth daily., Disp: , Rfl:  guaiFENesin (MUCINEX) 600 MG 12 hr tablet, Take 600 mg by mouth 2 (two) times daily., Disp: , Rfl: ;  Ipratropium-Albuterol (COMBIVENT RESPIMAT) 20-100 MCG/ACT AERS respimat, Inhale 1 puff into the lungs every 6 (six) hours., Disp: , Rfl: ;  ipratropium-albuterol (DUONEB) 0.5-2.5 (3)  MG/3ML SOLN, Take 3 mLs by nebulization every 6 (six) hours as needed., Disp: , Rfl: ;  KLOR-CON M10 10 MEQ tablet, Take 10 mEq by mouth daily. , Disp: , Rfl:  latanoprost (XALATAN) 0.005 % ophthalmic solution, Place 1 drop into both eyes at bedtime. , Disp: , Rfl: ;  loratadine (CLARITIN) 10 MG tablet, Take 10 mg by mouth daily., Disp: , Rfl: ;  metFORMIN (GLUCOPHAGE) 500 MG tablet, 500 mg 2 (two) times daily with a meal. , Disp: , Rfl: ;  montelukast (SINGULAIR) 10 MG tablet, Take 10 mg by mouth at bedtime. , Disp: , Rfl:  pantoprazole (PROTONIX) 40 MG tablet, Take 1 tablet by mouth 2 (two) times daily., Disp: , Rfl: ;  telmisartan (MICARDIS) 80 MG tablet, Take 80 mg by mouth daily. , Disp: , Rfl: ;  predniSONE (DELTASONE) 20 MG tablet, Take 3 pill a day for 3 days, then 2 pills a day for3 days, then 1 pill a day for 5 day, then 1/2 a pill for 5 days, Disp: 60 tablet, Rfl: 1  Allergies:  Allergies  Allergen Reactions  . Codeine Itching and Rash    Diffuse, severe rash    Past Medical History, Surgical history, Social history, and Family History were reviewed and updated.  Review of Systems: As above  Physical Exam:  weight is 180 lb (81.647 kg). Her temperature is 98 F (36.7 C). Her blood pressure is  149/66 and her pulse is 95. Her respiration is 20.   Thin white female. She is wheezing. Lungs show some crackles and wheezes bilaterally. Cardiac exam regular in rhythm. No murmurs. Neck exam shows no adenopathy. Abdomen is soft. She has good bowel sounds. There is no liver or spleen tip. Extremities shows some muscle atrophy bilaterally. She has some mild nonpitting edema of her legs. Skin exam shows no rashes. Neurological exam shows no focal neurological deficits.  Lab Results  Component Value Date   WBC 10.7* 04/08/2013   HGB 12.7 04/08/2013   HCT 37.7 04/08/2013   MCV 87.3 04/08/2013   PLT 385 04/08/2013     Chemistry      Component Value Date/Time   NA 141 04/08/2013 1419   NA 144  08/08/2011 1344   NA 141 02/19/2011 1323   K 4.0 04/08/2013 1419   K 4.6 08/08/2011 1344   K 3.9 02/19/2011 1323   CL 103 02/13/2012 1340   CL 103 08/08/2011 1344   CL 105 02/19/2011 1323   CO2 26 04/08/2013 1419   CO2 28 08/08/2011 1344   CO2 22 02/19/2011 1323   BUN 10.8 04/08/2013 1419   BUN 14 08/08/2011 1344   BUN 15 02/19/2011 1323   CREATININE 0.7 04/08/2013 1419   CREATININE 0.9 08/08/2011 1344   CREATININE 0.69 02/19/2011 1323      Component Value Date/Time   CALCIUM 10.2 04/08/2013 1419   CALCIUM 9.3 08/08/2011 1344   CALCIUM 9.8 02/19/2011 1323   ALKPHOS 91 04/08/2013 1419   ALKPHOS 66 08/08/2011 1344   ALKPHOS 67 02/19/2011 1323   AST 17 04/08/2013 1419   AST 21 08/08/2011 1344   AST 15 02/19/2011 1323   ALT 18 04/08/2013 1419   ALT 22 08/08/2011 1344   ALT 14 02/19/2011 1323   BILITOT 0.41 04/08/2013 1419   BILITOT 0.90 08/08/2011 1344   BILITOT 0.4 02/19/2011 1323      Doppler exam of her legs is negative for thromboembolic disease.  Impression and Plan: Teresa Anthony is 68 year old female. She has a second lung primary. Again, no biopsy has been done but I don't think this is imperative as she's not a candidate for any surgery nor for any systemic therapy.  Her underlying lung i disease will be the determine of her prognosis.  She comes in with her husband and daughter. Her husband has myeloma. I will be seeing him in the future.  Her lab work looks okay that was done last month. We did not do any today. I will like to see her back in another month or so.  We will try to get her a better portable oxygen system.  I spent over a half hour with her today. I was going over what was the situation with her lung cancer and how we can help manage any type of complication.   Volanda Napoleon, MD 3/20/20156:25 PM

## 2013-05-23 ENCOUNTER — Ambulatory Visit: Payer: Medicare Other | Admitting: Radiation Oncology

## 2013-05-23 ENCOUNTER — Ambulatory Visit
Admission: RE | Admit: 2013-05-23 | Discharge: 2013-05-23 | Disposition: A | Discharge status: Home / Self Care | Payer: Medicare Other | Source: Ambulatory Visit | Attending: Radiation Oncology | Admitting: Radiation Oncology

## 2013-05-23 ENCOUNTER — Encounter: Payer: Self-pay | Admitting: Radiation Oncology

## 2013-05-23 VITALS — BP 135/70 | HR 108 | Temp 99.1°F | Resp 20 | Wt 182.0 lb

## 2013-05-23 DIAGNOSIS — C3492 Malignant neoplasm of unspecified part of left bronchus or lung: Secondary | ICD-10-CM

## 2013-05-23 NOTE — Addendum Note (Signed)
Encounter addended by: Andria Rhein, RN on: 05/23/2013  3:19 PM<BR>     Documentation filed: Inpatient Patient Education

## 2013-05-23 NOTE — Addendum Note (Signed)
Encounter addended by: Andria Rhein, RN on: 05/23/2013  3:56 PM<BR>     Documentation filed: Chief Complaint Section

## 2013-05-23 NOTE — Progress Notes (Signed)
Weekly Management Note:  Site: Left lung/hilum Current Dose:  1000  cGy Projected Dose: 5000  cGy  Narrative: The patient is seen today for routine under treatment assessment. CBCT/MVCT images/port films were reviewed. The chart was reviewed.   She is without complaints today. Treatment setup is excellent.  Physical Examination: There were no vitals filed for this visit..  Weight:  . No change.  Impression: Tolerating radiation therapy well.  Plan: Continue radiation therapy as planned.

## 2013-05-23 NOTE — Progress Notes (Signed)
Please see note from earlier today regarding weekly management.    Radiation Oncology         (336) [phone] ________________________________  Name: Teresa Anthony MRN: [id number]  Date: 05/23/2013  DOB: March 21, 1945  Stereotactic Body Radiotherapy Treatment Procedure Note  NARRATIVE:  Teresa Anthony was brought to the stereotactic radiation treatment machine and placed supine on the CT couch. The patient was set up for stereotactic body radiotherapy on the body fix pillow.  3D TREATMENT PLANNING AND DOSIMETRY:  The patient's radiation plan was reviewed and approved prior to starting treatment.  It showed 3-dimensional radiation distributions overlaid onto the planning CT.  The [facility] for the target structures as well as the organs at risk were reviewed. The documentation of this is filed in the radiation oncology EMR.  SIMULATION VERIFICATION:  The patient underwent CT imaging on the treatment unit.  These were carefully aligned to document that the ablative radiation dose would cover the target volume and maximally spare the nearby organs at risk according to the planned distribution.  SPECIAL TREATMENT PROCEDURE: Teresa Anthony received high dose ablative stereotactic body radiotherapy to the planned target volume without unforeseen complications. Treatment was delivered uneventfully. The high doses associated with stereotactic body radiotherapy and the significant potential risks require careful treatment set up and patient monitoring constituting a special treatment procedure   STEREOTACTIC TREATMENT MANAGEMENT:  Following delivery, the patient was evaluated clinically. The patient tolerated treatment without significant acute effects, and was discharged to home in stable condition.    PLAN: Continue treatment as planned.  ------------------------------------------------        Rexene Edison, MD

## 2013-05-23 NOTE — Progress Notes (Signed)
Pt denies pain, loss of appetite. She reports SOB w/ADL's and exertion, productive cough w/white sputum, fatigue. Per Dr Marin Olp, pt on Prednisone, tapering doses, for wheezing. She states her wheezing has decreased. She remains on continuous O2 @ 2L/min. Post sim ed completed w/pt and daughter. Discussed fatigue as only temporary side effect listed on pt's consent to treat form. Pt verbalized understanding.

## 2013-05-24 ENCOUNTER — Ambulatory Visit
Admission: RE | Admit: 2013-05-24 | Discharge: 2013-05-24 | Disposition: A | Discharge status: Home / Self Care | Payer: Medicare Other | Source: Ambulatory Visit | Attending: Radiation Oncology | Admitting: Radiation Oncology

## 2013-05-24 DIAGNOSIS — C3490 Malignant neoplasm of unspecified part of unspecified bronchus or lung: Secondary | ICD-10-CM

## 2013-05-24 NOTE — Progress Notes (Signed)
  Radiation Oncology         [phone]) [phone] ________________________________  Name: Teresa Anthony MRN: [id number]  Date: 05/24/2013  DOB: [birth year]  Stereotactic Body Radiotherapy Treatment Procedure Note  NARRATIVE:  Teresa Anthony was brought to the stereotactic radiation treatment machine and placed supine on the CT couch. The patient was set up for stereotactic body radiotherapy on the body fix pillow.  3D TREATMENT PLANNING AND DOSIMETRY:  The patient's radiation plan was reviewed and approved prior to starting treatment.  It showed 3-dimensional radiation distributions overlaid onto the planning CT.  The [facility] for the target structures as well as the organs at risk were reviewed. The documentation of this is filed in the radiation oncology EMR.  SIMULATION VERIFICATION:  The patient underwent CT imaging on the treatment unit.  These were carefully aligned to document that the ablative radiation dose would cover the target volume and maximally spare the nearby organs at risk according to the planned distribution.  SPECIAL TREATMENT PROCEDURE: Teresa Anthony received high dose ablative stereotactic body radiotherapy to the planned target volume without unforeseen complications. Treatment was delivered uneventfully. The high doses associated with stereotactic body radiotherapy and the significant potential risks require careful treatment set up and patient monitoring constituting a special treatment procedure   STEREOTACTIC TREATMENT MANAGEMENT:  Following delivery, the patient was evaluated clinically. The patient tolerated treatment without significant acute effects, and was discharged to home in stable condition.    PLAN: Continue treatment as planned.  ------------------------------------------------        Rexene Edison, MD

## 2013-05-25 ENCOUNTER — Ambulatory Visit
Admission: RE | Admit: 2013-05-25 | Discharge: 2013-05-25 | Disposition: A | Discharge status: Home / Self Care | Payer: Medicare Other | Source: Ambulatory Visit | Attending: Radiation Oncology | Admitting: Radiation Oncology

## 2013-05-25 DIAGNOSIS — C342 Malignant neoplasm of middle lobe, bronchus or lung: Secondary | ICD-10-CM

## 2013-05-25 NOTE — Progress Notes (Signed)
Name: Teresa Anthony MRN: [id number]   Date: 05/25/2013 DOB: 11/17/1945   Stereotactic Body Radiotherapy Treatment Procedure Note   NARRATIVE: Teresa Anthony was brought to the stereotactic radiation treatment machine and placed supine on the CT couch. The patient was set up for stereotactic body radiotherapy on the body fix pillow.   3D TREATMENT PLANNING AND DOSIMETRY: The patient's radiation plan was reviewed and approved prior to starting treatment. It showed 3-dimensional radiation distributions overlaid onto the planning CT. The [facility] for the target structures as well as the organs at risk were reviewed. The documentation of this is filed in the radiation oncology EMR.   SIMULATION VERIFICATION: The patient underwent CT imaging on the treatment unit. These were carefully aligned to document that the ablative radiation dose would cover the target volume and maximally spare the nearby organs at risk according to the planned distribution.   SPECIAL TREATMENT PROCEDURE: Teresa Anthony received high dose ablative stereotactic body radiotherapy to the planned target volume without unforeseen complications. Treatment was delivered uneventfully. The high doses associated with stereotactic body radiotherapy and the significant potential risks require careful treatment set up and patient monitoring constituting a special treatment procedure   STEREOTACTIC TREATMENT MANAGEMENT: Following delivery, the patient was evaluated clinically. The patient tolerated treatment without significant acute effects, and was discharged to home in stable condition.   PLAN: Fraction #4 delivered today. Continue treatment as planned.

## 2013-05-26 ENCOUNTER — Ambulatory Visit
Admission: RE | Admit: 2013-05-26 | Discharge: 2013-05-26 | Disposition: A | Discharge status: Home / Self Care | Payer: Medicare Other | Source: Ambulatory Visit | Attending: Radiation Oncology | Admitting: Radiation Oncology

## 2013-05-26 DIAGNOSIS — C3490 Malignant neoplasm of unspecified part of unspecified bronchus or lung: Secondary | ICD-10-CM

## 2013-05-26 DIAGNOSIS — C3492 Malignant neoplasm of unspecified part of left bronchus or lung: Secondary | ICD-10-CM

## 2013-05-26 NOTE — Progress Notes (Signed)
  Radiation Oncology         (336) [phone] ________________________________  Name: Teresa Anthony MRN: [id number]  Date: 05/26/2013  DOB: 1945/09/16  Stereotactic Body Radiotherapy Treatment Procedure Note  NARRATIVE:  Teresa Anthony was brought to the stereotactic radiation treatment machine and placed supine on the CT couch. The patient was set up for stereotactic body radiotherapy on the body fix pillow.  3D TREATMENT PLANNING AND DOSIMETRY:  The patient's radiation plan was reviewed and approved prior to starting treatment.  It showed 3-dimensional radiation distributions overlaid onto the planning CT.  The [facility] for the target structures as well as the organs at risk were reviewed. The documentation of this is filed in the radiation oncology EMR.  SIMULATION VERIFICATION:  The patient underwent CT imaging on the treatment unit.  These were carefully aligned to document that the ablative radiation dose would cover the target volume and maximally spare the nearby organs at risk according to the planned distribution.  SPECIAL TREATMENT PROCEDURE: Teresa Anthony received high dose ablative stereotactic body radiotherapy to the planned target volume without unforeseen complications. Treatment was delivered uneventfully. The high doses associated with stereotactic body radiotherapy and the significant potential risks require careful treatment set up and patient monitoring constituting a special treatment procedure   STEREOTACTIC TREATMENT MANAGEMENT:  Following delivery, the patient was evaluated clinically. The patient tolerated treatment without significant acute effects, and was discharged to home in stable condition.    PLAN: Continue treatment as planned.  ------------------------------------------------        Rexene Edison, MD

## 2013-05-27 ENCOUNTER — Ambulatory Visit
Admission: RE | Admit: 2013-05-27 | Discharge: 2013-05-27 | Disposition: A | Discharge status: Home / Self Care | Payer: Medicare Other | Source: Ambulatory Visit | Attending: Radiation Oncology | Admitting: Radiation Oncology

## 2013-05-27 DIAGNOSIS — C342 Malignant neoplasm of middle lobe, bronchus or lung: Secondary | ICD-10-CM

## 2013-05-28 NOTE — Progress Notes (Signed)
  Radiation Oncology (336) [phone]  ________________________________  Name: Teresa Anthony MRN: [id number]  Date: 05/27/2013 DOB: [birth year]   Stereotactic Body Radiotherapy Treatment Procedure Note   NARRATIVE: Teresa Anthony was brought to the stereotactic radiation treatment machine and placed supine on the CT couch. The patient was set up for stereotactic body radiotherapy on the body fix pillow.   3D TREATMENT PLANNING AND DOSIMETRY: The patient's radiation plan was reviewed and approved prior to starting treatment. It showed 3-dimensional radiation distributions overlaid onto the planning CT. The [facility] for the target structures as well as the organs at risk were reviewed. The documentation of this is filed in the radiation oncology EMR.   SIMULATION VERIFICATION: The patient underwent CT imaging on the treatment unit. These were carefully aligned to document that the ablative radiation dose would cover the target volume and maximally spare the nearby organs at risk according to the planned distribution.   SPECIAL TREATMENT PROCEDURE: Teresa Anthony received high dose ablative stereotactic body radiotherapy to the planned target volume without unforeseen complications. Treatment was delivered uneventfully. The high doses associated with stereotactic body radiotherapy and the significant potential risks require careful treatment set up and patient monitoring constituting a special treatment procedure   STEREOTACTIC TREATMENT MANAGEMENT: Following delivery, the patient was evaluated clinically. The patient tolerated treatment without significant acute effects, and was discharged to home in stable condition.   PLAN: Continue treatment as planned.

## 2013-05-30 ENCOUNTER — Encounter: Payer: Self-pay | Admitting: Radiation Oncology

## 2013-05-30 ENCOUNTER — Ambulatory Visit
Admission: RE | Admit: 2013-05-30 | Discharge: 2013-05-30 | Disposition: A | Discharge status: Home / Self Care | Payer: Medicare Other | Source: Ambulatory Visit | Attending: Radiation Oncology | Admitting: Radiation Oncology

## 2013-05-30 VITALS — BP 133/81 | HR 103 | Temp 98.2°F | Resp 20 | Wt 178.5 lb

## 2013-05-30 DIAGNOSIS — C342 Malignant neoplasm of middle lobe, bronchus or lung: Secondary | ICD-10-CM

## 2013-05-30 NOTE — Progress Notes (Signed)
  Radiation Oncology         (336) [phone] ________________________________  Name: Teresa Anthony MRN: [id number]  Date: 05/30/2013  DOB: 1945/11/27  Stereotactic Body Radiotherapy Treatment Procedure Note Chest  NARRATIVE:  Cydnie Deason was brought to the stereotactic radiation treatment machine and placed supine on the CT couch. The patient was set up for stereotactic body radiotherapy on the body fix pillow.  3D TREATMENT PLANNING AND DOSIMETRY:  The patient's radiation plan was reviewed and approved prior to starting treatment.  It showed 3-dimensional radiation distributions overlaid onto the planning CT.  The [facility] for the target structures as well as the organs at risk were reviewed. The documentation of this is filed in the radiation oncology EMR.  SIMULATION VERIFICATION:  The patient underwent CT imaging on the treatment unit.  These were carefully aligned to document that the ablative radiation dose would cover the target volume and maximally spare the nearby organs at risk according to the planned distribution.  SPECIAL TREATMENT PROCEDURE: Anorah Orris received high dose ablative stereotactic body radiotherapy to the planned target volume without unforeseen complications. Treatment was delivered uneventfully. The high doses associated with stereotactic body radiotherapy and the significant potential risks require careful treatment set up and patient monitoring constituting a special treatment procedure   STEREOTACTIC TREATMENT MANAGEMENT:  Following delivery, the patient was evaluated clinically. The patient tolerated treatment without significant acute effects, and was discharged to home in stable condition.    PLAN: Continue treatment as planned.  ________________________________  Eppie Gibson, MD

## 2013-05-30 NOTE — Progress Notes (Signed)
Pt states on Saturday she developed sore throat, clear nasal drainage which she "feels in the back of her throat", watery eyes, productive cough w/white phlegm. She also has headache "between her eyes". She has history of allergies, takes Mucinex, Claritan, Singulair daily. She states her SOB is improved, on continuous O2 @ 2L/min. Pt fatigued, no loss of appetite. She is still on tapering Prednisone for wheezing, per Dr Marin Olp.

## 2013-05-30 NOTE — Progress Notes (Signed)
   Weekly Management Note:  outpatient Current Dose:  35 Gy  Projected Dose: 50 Gy   Narrative:  The patient presents for routine under treatment assessment.  CBCT/MVCT images/Port film x-rays were reviewed.  The chart was checked. Seasonal Allergies are bothering her.  But, breathing is a little better - "less tight".    Physical Findings:  weight is 178 lb 8 oz (80.967 kg). Her temperature is 98.2 F (36.8 C). Her blood pressure is 133/81 and her pulse is 103. Her respiration is 20 and oxygen saturation is 96%.  decreased lung sounds b/l with scattered faint wheezes. In wheelchair, O2 per [place]  CBC    Component Value Date/Time   WBC 10.7* 04/08/2013 1419   RBC 4.32 04/08/2013 1419   HGB 12.7 04/08/2013 1419   HCT 37.7 04/08/2013 1419   PLT 385 04/08/2013 1419   MCV 87.3 04/08/2013 1419   MCH 29.3 04/08/2013 1419   MCHC 33.6 04/08/2013 1419   RDW 14.4 04/08/2013 1419   LYMPHSABS 1.7 04/08/2013 1419   MONOABS 0.6 04/08/2013 1419   EOSABS 0.1 04/08/2013 1419   BASOSABS 0.1 04/08/2013 1419     CMP     Component Value Date/Time   NA 141 04/08/2013 1419   NA 144 08/08/2011 1344   NA 141 02/19/2011 1323   K 4.0 04/08/2013 1419   K 4.6 08/08/2011 1344   K 3.9 02/19/2011 1323   CL 103 02/13/2012 1340   CL 103 08/08/2011 1344   CL 105 02/19/2011 1323   CO2 26 04/08/2013 1419   CO2 28 08/08/2011 1344   CO2 22 02/19/2011 1323   GLUCOSE 111 04/08/2013 1419   GLUCOSE 136* 02/13/2012 1340   GLUCOSE 94 08/08/2011 1344   GLUCOSE 113* 02/19/2011 1323   BUN 10.8 04/08/2013 1419   BUN 14 08/08/2011 1344   BUN 15 02/19/2011 1323   CREATININE 0.7 04/08/2013 1419   CREATININE 0.9 08/08/2011 1344   CREATININE 0.69 02/19/2011 1323   CALCIUM 10.2 04/08/2013 1419   CALCIUM 9.3 08/08/2011 1344   CALCIUM 9.8 02/19/2011 1323   PROT 6.7 04/08/2013 1419   PROT 7.1 08/08/2011 1344   PROT 6.8 02/19/2011 1323   ALBUMIN 3.6 04/08/2013 1419   ALBUMIN 4.4 02/19/2011 1323   AST 17 04/08/2013 1419   AST 21 08/08/2011 1344   AST 15 02/19/2011 1323   ALT 18  04/08/2013 1419   ALT 22 08/08/2011 1344   ALT 14 02/19/2011 1323   ALKPHOS 91 04/08/2013 1419   ALKPHOS 66 08/08/2011 1344   ALKPHOS 67 02/19/2011 1323   BILITOT 0.41 04/08/2013 1419   BILITOT 0.90 08/08/2011 1344   BILITOT 0.4 02/19/2011 1323     Impression:  The patient is tolerating radiotherapy.   Plan:  Continue radiotherapy as planned.  -----------------------------------  Eppie Gibson, MD

## 2013-05-31 ENCOUNTER — Encounter: Payer: Self-pay | Admitting: Radiation Oncology

## 2013-05-31 ENCOUNTER — Ambulatory Visit
Admission: RE | Admit: 2013-05-31 | Discharge: 2013-05-31 | Disposition: A | Discharge status: Home / Self Care | Payer: Medicare Other | Source: Ambulatory Visit | Attending: Radiation Oncology | Admitting: Radiation Oncology

## 2013-06-01 ENCOUNTER — Encounter: Payer: Self-pay | Admitting: Radiation Oncology

## 2013-06-01 ENCOUNTER — Ambulatory Visit
Admission: RE | Admit: 2013-06-01 | Discharge: 2013-06-01 | Disposition: A | Discharge status: Home / Self Care | Payer: Medicare Other | Source: Ambulatory Visit | Attending: Radiation Oncology | Admitting: Radiation Oncology

## 2013-06-01 NOTE — Progress Notes (Signed)
  Radiation Oncology         [phone]) [phone] ________________________________  Name: Teresa Anthony MRN: [id number]  Date: 05/31/2013  DOB: 1945/07/24  Stereotactic Body Radiotherapy Treatment Procedure Note 8th of 10 treatments to chest  NARRATIVE:  Teresa Anthony was brought to the stereotactic radiation treatment machine and placed supine on the CT couch. The patient was set up for stereotactic body radiotherapy on the body fix pillow.  3D TREATMENT PLANNING AND DOSIMETRY:  The patient's radiation plan was reviewed and approved prior to starting treatment.  It showed 3-dimensional radiation distributions overlaid onto the planning CT.  The [facility] for the target structures as well as the organs at risk were reviewed. The documentation of this is filed in the radiation oncology EMR.  SIMULATION VERIFICATION:  The patient underwent CT imaging on the treatment unit.  These were carefully aligned to document that the ablative radiation dose would cover the target volume and maximally spare the nearby organs at risk according to the planned distribution.  SPECIAL TREATMENT PROCEDURE: Teresa Anthony received high dose ablative stereotactic body radiotherapy to the planned target volume without unforeseen complications. Treatment was delivered uneventfully. The high doses associated with stereotactic body radiotherapy and the significant potential risks require careful treatment set up and patient monitoring constituting a special treatment procedure   STEREOTACTIC TREATMENT MANAGEMENT:  Following delivery, the patient was evaluated clinically. The patient tolerated treatment without significant acute effects, and was discharged to home in stable condition.    PLAN: Continue treatment as planned.  ________________________________  Eppie Gibson, MD

## 2013-06-01 NOTE — Progress Notes (Signed)
  Radiation Oncology         [phone] ________________________________  Name: Folashade Gamboa MRN: [id number]  Date: 06/01/2013  DOB: February 22, 1946  Stereotactic Body Radiotherapy Treatment Procedure Note 9th of 10 treatments to chest  NARRATIVE:  Sheridyn Canino was brought to the stereotactic radiation treatment machine and placed supine on the CT couch. The patient was set up for stereotactic body radiotherapy on the body fix pillow.  3D TREATMENT PLANNING AND DOSIMETRY:  The patient's radiation plan was reviewed and approved prior to starting treatment.  It showed 3-dimensional radiation distributions overlaid onto the planning CT.  The [facility] for the target structures as well as the organs at risk were reviewed. The documentation of this is filed in the radiation oncology EMR.  SIMULATION VERIFICATION:  The patient underwent CT imaging on the treatment unit.  These were carefully aligned to document that the ablative radiation dose would cover the target volume and maximally spare the nearby organs at risk according to the planned distribution.  SPECIAL TREATMENT PROCEDURE: Xan Samples received high dose ablative stereotactic body radiotherapy to the planned target volume without unforeseen complications. Treatment was delivered uneventfully. The high doses associated with stereotactic body radiotherapy and the significant potential risks require careful treatment set up and patient monitoring constituting a special treatment procedure   STEREOTACTIC TREATMENT MANAGEMENT:  Following delivery, the patient was evaluated clinically. The patient tolerated treatment without significant acute effects, and was discharged to home in stable condition.    PLAN: Continue treatment as planned.  ________________________________  Eppie Gibson, MD

## 2013-06-02 ENCOUNTER — Ambulatory Visit
Admission: RE | Admit: 2013-06-02 | Discharge: 2013-06-02 | Disposition: A | Discharge status: Home / Self Care | Payer: Medicare Other | Source: Ambulatory Visit | Attending: Radiation Oncology | Admitting: Radiation Oncology

## 2013-06-03 ENCOUNTER — Ambulatory Visit: Payer: Medicare Other | Admitting: Radiation Oncology

## 2013-06-04 ENCOUNTER — Encounter: Payer: Self-pay | Admitting: Radiation Oncology

## 2013-06-04 NOTE — Progress Notes (Signed)
[facility] End of Treatment Note  Name:Teresa Anthony  Date: 06/04/2013 OVP:[id number] DOB:18-Jul-1945   Status:outpatient    CC: Shellia Carwin, PA-C  Dr. Murriel Hopper, Dr. Burney Gauze  REFERRING PHYSICIAN:   Dr. 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 Lowman tolerated treatment well with no significant toxicity during her course of therapy                       PLAN: Routine followup in one month. Patient instructed to call if questions or worsening complaints in interim.

## 2013-06-15 ENCOUNTER — Ambulatory Visit (HOSPITAL_BASED_OUTPATIENT_CLINIC_OR_DEPARTMENT_OTHER): Payer: Medicare Other | Admitting: Hematology & Oncology

## 2013-06-15 ENCOUNTER — Encounter: Payer: Self-pay | Admitting: Hematology & Oncology

## 2013-06-15 VITALS — BP 124/61 | HR 96 | Temp 98.1°F | Resp 16 | Ht 66.0 in | Wt 182.0 lb

## 2013-06-15 DIAGNOSIS — C342 Malignant neoplasm of middle lobe, bronchus or lung: Secondary | ICD-10-CM

## 2013-06-15 DIAGNOSIS — R600 Localized edema: Secondary | ICD-10-CM

## 2013-06-15 DIAGNOSIS — J449 Chronic obstructive pulmonary disease, unspecified: Secondary | ICD-10-CM

## 2013-06-15 DIAGNOSIS — Z85118 Personal history of other malignant neoplasm of bronchus and lung: Secondary | ICD-10-CM

## 2013-06-15 MED ORDER — METOLAZONE 2.5 MG PO TABS: ORAL_TABLET | ORAL | Status: AC

## 2013-06-15 NOTE — Progress Notes (Signed)
Hematology and Oncology Follow Up Visit  Teresa Anthony [id number] [birth year] 68 y.o. 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  Teresa Anthony is back for followup. She completed 5000 rad of radiation to the left lung cancer on April 2. She tolerated this very well. She had stereotactic radiosurgery but at lower doses.  She feels well. She really did not have much in way of problems with the radiation. There is no shortness of breath is increased. She's had a little bit of a cough. She's had no hemoptysis.  Her appetite has been okay. There's been no nausea vomiting. She's had no dysphasia or odontophagia.  She has had some more swelling in her legs. I suspect this might be from her medications. She is on Norvasc and prednisone. I did go ahead and put her on a little bit of Zaroxolyn to try to help with the leg swelling.  She is not having any fever. She is not having diarrhea or constipation. She's not had any headache.  Overall, her performance status is ECOG 2 Medications: Current outpatient prescriptions:ADVAIR DISKUS 500-50 MCG/DOSE AEPB, 1 puff 2 (two) times daily. , Disp: , Rfl: ;  ALPRAZolam (XANAX) 0.25 MG tablet, Take 1 tablet by mouth at bedtime as needed. 1/2 -1 tab at night prn sleep, Disp: , Rfl: ;  amLODipine (NORVASC) 2.5 MG tablet, Take 5 mg by mouth daily. , Disp: , Rfl: ;  aspirin 325 MG tablet, Take 325 mg by mouth daily., Disp: , Rfl:  buPROPion (WELLBUTRIN) 75 MG tablet, Take 1 tablet by mouth 2 (two) times daily., Disp: , Rfl: ;  Calcium Carbonate-Vitamin D (CALCIUM 500 + D) 500-125 MG-UNIT TABS, Take 1 tablet by mouth 2 (two) times daily., Disp: , Rfl: ;  CRESTOR 5 MG tablet, Take 5 mg by mouth daily. , Disp: , Rfl: ;  famotidine (PEPCID) 20 MG  tablet, Take 20 mg by mouth daily., Disp: , Rfl:  guaiFENesin (MUCINEX) 600 MG 12 hr tablet, Take 600 mg by mouth 2 (two) times daily., Disp: , Rfl: ;  Ipratropium-Albuterol (COMBIVENT RESPIMAT) 20-100 MCG/ACT AERS respimat, Inhale 1 puff into the lungs every 6 (six) hours., Disp: , Rfl: ;  ipratropium-albuterol (DUONEB) 0.5-2.5 (3) MG/3ML SOLN, Take 3 mLs by nebulization every 6 (six) hours as needed., Disp: , Rfl: ;  KLOR-CON M10 10 MEQ tablet, Take 10 mEq by mouth daily. , Disp: , Rfl:  latanoprost (XALATAN) 0.005 % ophthalmic solution, Place 1 drop into both eyes at bedtime. , Disp: , Rfl: ;  loratadine (CLARITIN) 10 MG tablet, Take 10 mg by mouth daily., Disp: , Rfl: ;  metFORMIN (GLUCOPHAGE) 500 MG tablet, 500 mg 2 (two) times daily with a meal. , Disp: , Rfl: ;  montelukast (SINGULAIR) 10 MG tablet, Take 10 mg by mouth at bedtime. , Disp: , Rfl:  pantoprazole (PROTONIX) 40 MG tablet, Take 1 tablet by mouth 2 (two) times daily., Disp: , Rfl: ;  telmisartan (MICARDIS) 80 MG tablet, Take 80 mg by mouth daily. , Disp: , Rfl: ;  metolazone (ZAROXOLYN) 2.5 MG tablet, Take 1/2 tablet once a day, IF NEEDED, for fluid in legs, Disp: 30 tablet, Rfl: 3  Allergies:  Allergies  Allergen Reactions  . Codeine Itching and  Rash    Diffuse, severe rash    Past Medical History, Surgical history, Social history, and Family History were reviewed and updated.  Review of Systems: As above  Physical Exam:  height is 5\' 6"  (1.676 m) and weight is 182 lb (82.555 kg). Her oral temperature is 98.1 F (36.7 C). Her blood pressure is 124/61 and her pulse is 96. Her respiration is 16 and oxygen saturation is 98%.   Fairly well-developed and well-nourished white female. She is slightly cushingoid from steroids. Her head and neck exam shows no ocular or oral lesions. She has no thrush. There is no adenopathy in the neck. Lungs show decreased breath sounds throughout both lung fields. His bilateral crackles. She has  bilateral wheezes. Cardiac exam regular rate and rhythm with no murmurs rubs or bruits. She has an occasional extra beat. Abdomen is soft. Has good bowel sounds. There is no fluid wave. There is a palpable liver or spleen tip. Back exam no tenderness over the spine. Extremities shows some 1+ edema in her lower legs. She has some stasis dermatitis type changes in her lower legs. She has 4+/5 strength bilaterally. Skin exam shows the stasis dermatitis. She has no ecchymoses or petechia. Neurological exam is nonfocal.  Lab Results  Component Value Date   WBC 10.7* 04/08/2013   HGB 12.7 04/08/2013   HCT 37.7 04/08/2013   MCV 87.3 04/08/2013   PLT 385 04/08/2013     Chemistry      Component Value Date/Time   NA 141 04/08/2013 1419   NA 144 08/08/2011 1344   NA 141 02/19/2011 1323   K 4.0 04/08/2013 1419   K 4.6 08/08/2011 1344   K 3.9 02/19/2011 1323   CL 103 02/13/2012 1340   CL 103 08/08/2011 1344   CL 105 02/19/2011 1323   CO2 26 04/08/2013 1419   CO2 28 08/08/2011 1344   CO2 22 02/19/2011 1323   BUN 10.8 04/08/2013 1419   BUN 14 08/08/2011 1344   BUN 15 02/19/2011 1323   CREATININE 0.7 04/08/2013 1419   CREATININE 0.9 08/08/2011 1344   CREATININE 0.69 02/19/2011 1323      Component Value Date/Time   CALCIUM 10.2 04/08/2013 1419   CALCIUM 9.3 08/08/2011 1344   CALCIUM 9.8 02/19/2011 1323   ALKPHOS 91 04/08/2013 1419   ALKPHOS 66 08/08/2011 1344   ALKPHOS 67 02/19/2011 1323   AST 17 04/08/2013 1419   AST 21 08/08/2011 1344   AST 15 02/19/2011 1323   ALT 18 04/08/2013 1419   ALT 22 08/08/2011 1344   ALT 14 02/19/2011 1323   BILITOT 0.41 04/08/2013 1419   BILITOT 0.90 08/08/2011 1344   BILITOT 0.4 02/19/2011 1323         Impression and Plan: Teresa Anthony is a 68 year old white female. She has a second primary lung cancer. This is a squamous cell carcinoma. Into the left lung. She underwent stereotactic radiosurgery for this. Hopefully, this has worked. I would think that it has worked.  I think we should probably get a  followup CT scan in about 4 or 5 weeks now. I think a CT scan about 6 weeks after her radiation would be appropriate.  Her quality of life is what is important for her. She has really bad underlying COPD. She is oxygen dependent.  We will see her back the same day she has her scans done.  Hopefully,   Volanda Napoleon, MD 4/15/20156:20 PM

## 2013-06-16 ENCOUNTER — Telehealth: Payer: Self-pay | Admitting: Hematology & Oncology

## 2013-06-16 NOTE — Telephone Encounter (Signed)
Pt aware of 5-27 appointments, and to not eat 4 hrs priot to CT. Pt said Dr. Marin Olp would see her husband the same day. She is aware he is scheduled

## 2013-07-05 ENCOUNTER — Encounter: Payer: Self-pay | Admitting: *Deleted

## 2013-07-12 ENCOUNTER — Ambulatory Visit
Admission: RE | Admit: 2013-07-12 | Discharge: 2013-07-12 | Disposition: A | Discharge status: Home / Self Care | Payer: Medicare Other | Source: Ambulatory Visit | Attending: Radiation Oncology | Admitting: Radiation Oncology

## 2013-07-12 ENCOUNTER — Encounter: Payer: Self-pay | Admitting: Radiation Oncology

## 2013-07-12 VITALS — BP 116/62 | HR 97 | Temp 98.3°F | Resp 20 | Wt 172.0 lb

## 2013-07-12 DIAGNOSIS — C3492 Malignant neoplasm of unspecified part of left bronchus or lung: Secondary | ICD-10-CM

## 2013-07-12 DIAGNOSIS — B029 Zoster without complications: Secondary | ICD-10-CM

## 2013-07-12 MED ORDER — ACYCLOVIR 800 MG PO TABS: 800.0000 mg | ORAL_TABLET | Freq: Every day | ORAL | Status: DC

## 2013-07-12 NOTE — Progress Notes (Signed)
CC: Dr. Burney Gauze  Followup note: Teresa Anthony visits today approximately 5 weeks following completion of radiation therapy (SBRT) to her left lung in the management of her suspected T2 N0 non-small cell carcinoma. Her major complaint is that of her discomfort along her left lower chest. Her discomfort began approximately 5 days ago, and yesterday she developed a "rash". Dr. Marin Olp placed her on a prednisone taper along with amoxicillin for her dyspnea and wheezing. She feels that her dyspnea is now stable. She continues with nasal O2 2 L a minute. Dr. Marin Olp has her scheduled for a followup CT scan on may 27.  Physical examination: Alert and oriented. She is on nasal O2. No acute respiratory distress. Filed Vitals:   07/12/13 1039  BP: 116/62  Pulse: 97  Temp: 98.3 F (36.8 C)  Resp: 20   Head and neck examination: Grossly unremarkable. Chest: There is an early vesicular rash along the left T11 or T12 dermatome. This is certainly shingles. Lungs: There are scattered wheezes throughout both lung zones.  Impression: Clinically stable from a respiratory standpoint. She has new-onset shingles along the left T11 or T12 dermatome. Her insurance limits her to acyclovir and thus I am prescribing 800 mg by mouth 5 times a day for 10 days.  Plan: As above. She will see Dr. Marin Olp after her CT scan on May 27, and I'll see her back here for a followup visit in 2 months. She knows that she can contact Dr. Marin Olp for management of her shingles and possible post herpetic neuralgia.

## 2013-07-12 NOTE — Progress Notes (Signed)
Pt and daughter very tearful today having lost son-in-law and sister in past 3 1/2 weeks. Pt states she has developed pain in her left side w/red raised rash she noticed yesterday. She has no history of shingles. The patch of rash is approximately 2-3 cm wide on left side/ribcage area. Pt also states she has had pain in right side of back, between her shoulder blades and in her mid abdomen x 3 weeks. She states the pain is constant, occasionally severe. She is not taking any medication for  this pain.  She also developed productive cough w/green sputum early May and refilled Prednisone and Amoxicillin which she had at home. She will complete Amoxicillin today, still on Prednisone. Pt states her SOB has increased and occurs w/any activity at all. She is on continuous O2 @ 2L/min. Pt fatigued w/loss of appetite.

## 2013-07-27 ENCOUNTER — Telehealth: Payer: Self-pay | Admitting: *Deleted

## 2013-07-27 ENCOUNTER — Ambulatory Visit: Payer: Medicare Other | Admitting: Hematology & Oncology

## 2013-07-27 ENCOUNTER — Ambulatory Visit (HOSPITAL_BASED_OUTPATIENT_CLINIC_OR_DEPARTMENT_OTHER)
Admission: RE | Admit: 2013-07-27 | Discharge: 2013-07-27 | Disposition: A | Discharge status: Home / Self Care | Payer: Medicare Other | Source: Ambulatory Visit | Attending: Hematology & Oncology | Admitting: Hematology & Oncology

## 2013-07-27 ENCOUNTER — Other Ambulatory Visit (HOSPITAL_BASED_OUTPATIENT_CLINIC_OR_DEPARTMENT_OTHER): Payer: Medicare Other | Admitting: Lab

## 2013-07-27 DIAGNOSIS — C3490 Malignant neoplasm of unspecified part of unspecified bronchus or lung: Secondary | ICD-10-CM | POA: Insufficient documentation

## 2013-07-27 DIAGNOSIS — I2510 Atherosclerotic heart disease of native coronary artery without angina pectoris: Secondary | ICD-10-CM | POA: Insufficient documentation

## 2013-07-27 DIAGNOSIS — C342 Malignant neoplasm of middle lobe, bronchus or lung: Secondary | ICD-10-CM

## 2013-07-27 DIAGNOSIS — J984 Other disorders of lung: Secondary | ICD-10-CM | POA: Insufficient documentation

## 2013-07-27 DIAGNOSIS — J438 Other emphysema: Secondary | ICD-10-CM | POA: Insufficient documentation

## 2013-07-27 LAB — CMP (CANCER CENTER ONLY)
ALK PHOS: 63 U/L (ref 26–84)
ALT(SGPT): 16 U/L (ref 10–47)
AST: 15 U/L (ref 11–38)
Albumin: 3.7 g/dL (ref 3.3–5.5)
BILIRUBIN TOTAL: 0.8 mg/dL (ref 0.20–1.60)
BUN: 20 mg/dL (ref 7–22)
CO2: 29 mEq/L (ref 18–33)
Calcium: 9.3 mg/dL (ref 8.0–10.3)
Chloride: 99 mEq/L (ref 98–108)
Creat: 0.8 mg/dl (ref 0.6–1.2)
GLUCOSE: 172 mg/dL — AB (ref 73–118)
Potassium: 4.4 mEq/L (ref 3.3–4.7)
SODIUM: 138 meq/L (ref 128–145)
Total Protein: 6.7 g/dL (ref 6.4–8.1)

## 2013-07-27 LAB — CBC WITH DIFFERENTIAL (CANCER CENTER ONLY)
BASO#: 0 10*3/uL (ref 0.0–0.2)
BASO%: 0.1 % (ref 0.0–2.0)
EOS%: 1.6 % (ref 0.0–7.0)
Eosinophils Absolute: 0.1 10*3/uL (ref 0.0–0.5)
HCT: 36.8 % (ref 34.8–46.6)
HGB: 12.3 g/dL (ref 11.6–15.9)
LYMPH#: 1.2 10*3/uL (ref 0.9–3.3)
LYMPH%: 16.9 % (ref 14.0–48.0)
MCH: 29.9 pg (ref 26.0–34.0)
MCHC: 33.4 g/dL (ref 32.0–36.0)
MCV: 90 fL (ref 81–101)
MONO#: 0.6 10*3/uL (ref 0.1–0.9)
MONO%: 9.1 % (ref 0.0–13.0)
NEUT#: 5 10*3/uL (ref 1.5–6.5)
NEUT%: 72.3 % (ref 39.6–80.0)
PLATELETS: 272 10*3/uL (ref 145–400)
RBC: 4.11 10*6/uL (ref 3.70–5.32)
RDW: 14.9 % (ref 11.1–15.7)
WBC: 6.9 10*3/uL (ref 3.9–10.0)

## 2013-07-27 MED ORDER — IOHEXOL 300 MG/ML  SOLN
80.0000 mL | Freq: Once | INTRAMUSCULAR | Status: AC | PRN
  Administered 2013-07-27: 80 mL via INTRAVENOUS

## 2013-07-27 NOTE — Telephone Encounter (Signed)
Left voicemail informing pt that cancer is gone.

## 2013-08-01 ENCOUNTER — Ambulatory Visit (HOSPITAL_BASED_OUTPATIENT_CLINIC_OR_DEPARTMENT_OTHER): Payer: Medicare Other | Admitting: Hematology & Oncology

## 2013-08-01 ENCOUNTER — Encounter: Payer: Self-pay | Admitting: Hematology & Oncology

## 2013-08-01 VITALS — BP 107/57 | HR 106 | Temp 98.0°F | Resp 16 | Ht 66.0 in | Wt 176.0 lb

## 2013-08-01 DIAGNOSIS — B029 Zoster without complications: Secondary | ICD-10-CM

## 2013-08-01 DIAGNOSIS — J449 Chronic obstructive pulmonary disease, unspecified: Secondary | ICD-10-CM

## 2013-08-01 DIAGNOSIS — C342 Malignant neoplasm of middle lobe, bronchus or lung: Secondary | ICD-10-CM

## 2013-08-01 NOTE — Progress Notes (Signed)
Hematology and Oncology Follow Up Visit  Teresa Anthony [id number] September 12, 1945 68 y.o. 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  Anthony is back for followup. She is doing fairly well.  Unfortunately, she did have an outbreak of shingles. This was about a week ago. It was in the left L3-4 dermatome. She has been on therapy for this. There is still some burning.   She underwent stereotactic radiotherapy. She had this back in April. She received 5000 on April 2. Therefore did a repeat CT scan on her. This was done last week. The CT scan showed resolution of the left lung nodule.  Otherwise, she is doing okay. She has a severe underlying COPD. She's on nebulizers. She coughs up some clear mucus in the morning.  Is no hemoptysis.. She's had no nausea vomiting. She's had no fever. No change in bowel or bladder habits.  Medications: Current outpatient prescriptions:ADVAIR DISKUS 500-50 MCG/DOSE AEPB, 1 puff 2 (two) times daily. , Disp: , Rfl: ;  ALPRAZolam (XANAX) 0.25 MG tablet, Take 1 tablet by mouth at bedtime as needed. 1/2 -1 tab at night prn sleep, Disp: , Rfl: ;  amLODipine (NORVASC) 2.5 MG tablet, Take 5 mg by mouth daily. , Disp: , Rfl: ;  aspirin 325 MG tablet, Take 325 mg by mouth daily., Disp: , Rfl:  buPROPion (WELLBUTRIN) 75 MG tablet, Take 1 tablet by mouth 2 (two) times daily., Disp: , Rfl: ;  Calcium Carbonate-Vitamin D (CALCIUM 500 + D) 500-125 MG-UNIT TABS, Take 1 tablet by mouth 2 (two) times daily., Disp: , Rfl: ;  CRESTOR 5 MG tablet, Take 5 mg by mouth daily. , Disp: , Rfl: ;  famotidine (PEPCID) 20 MG tablet, Take 20 mg by mouth daily., Disp: , Rfl:  guaiFENesin (MUCINEX) 600 MG 12 hr tablet, Take 600 mg by mouth 2 (two) times daily., Disp: , Rfl: ;   Ipratropium-Albuterol (COMBIVENT RESPIMAT) 20-100 MCG/ACT AERS respimat, Inhale 1 puff into the lungs every 6 (six) hours., Disp: , Rfl: ;  ipratropium-albuterol (DUONEB) 0.5-2.5 (3) MG/3ML SOLN, Take 3 mLs by nebulization every 6 (six) hours as needed., Disp: , Rfl: ;  KLOR-CON M10 10 MEQ tablet, Take 10 mEq by mouth daily. , Disp: , Rfl:  latanoprost (XALATAN) 0.005 % ophthalmic solution, Place 1 drop into both eyes at bedtime. , Disp: , Rfl: ;  loratadine (CLARITIN) 10 MG tablet, Take 10 mg by mouth daily., Disp: , Rfl: ;  metFORMIN (GLUCOPHAGE) 500 MG tablet, 500 mg 2 (two) times daily with a meal. , Disp: , Rfl: ;  metolazone (ZAROXOLYN) 2.5 MG tablet, Take 1/2 tablet once a day, IF NEEDED, for fluid in legs, Disp: 30 tablet, Rfl: 3 montelukast (SINGULAIR) 10 MG tablet, Take 10 mg by mouth at bedtime. , Disp: , Rfl: ;  pantoprazole (PROTONIX) 40 MG tablet, Take 1 tablet by mouth 2 (two) times daily., Disp: , Rfl: ;  telmisartan (MICARDIS) 80 MG tablet, Take 80 mg by mouth daily. , Disp: , Rfl:   Allergies:  Allergies  Allergen Reactions  . Codeine Itching and Rash    Diffuse, severe rash    Past Medical History, Surgical history, Social history, and Family History were reviewed and updated.  Review of Systems: As above  Physical  Exam:  height is 5\' 6"  (1.676 m) and weight is 176 lb (79.833 kg). Her oral temperature is 98 F (36.7 C). Her blood pressure is 107/57 and her pulse is 106. Her respiration is 16 and oxygen saturation is 96%.   Well-developed and well-nourished white female. Lungs are with decreased breath sounds bilateral. She has wheezes and crackles bilaterally. She has decent air movement. Cardiac exam regular in rhythm. Abdomen soft. Has good bowel sounds. There is no fluid wave. There is no palpable liver or spleen tip. Back exam no tenderness over the spine. Telemetry shows no clubbing cyanosis or edema. Lab Results  Component Value Date   WBC 6.9 07/27/2013   HGB 12.3  07/27/2013   HCT 36.8 07/27/2013   MCV 90 07/27/2013   PLT 272 07/27/2013     Chemistry      Component Value Date/Time   NA 138 07/27/2013 0939   NA 141 04/08/2013 1419   NA 141 02/19/2011 1323   K 4.4 07/27/2013 0939   K 4.0 04/08/2013 1419   K 3.9 02/19/2011 1323   CL 99 07/27/2013 0939   CL 103 02/13/2012 1340   CL 105 02/19/2011 1323   CO2 29 07/27/2013 0939   CO2 26 04/08/2013 1419   CO2 22 02/19/2011 1323   BUN 20 07/27/2013 0939   BUN 10.8 04/08/2013 1419   BUN 15 02/19/2011 1323   CREATININE 0.8 07/27/2013 0939   CREATININE 0.7 04/08/2013 1419   CREATININE 0.69 02/19/2011 1323      Component Value Date/Time   CALCIUM 9.3 07/27/2013 0939   CALCIUM 10.2 04/08/2013 1419   CALCIUM 9.8 02/19/2011 1323   ALKPHOS 63 07/27/2013 0939   ALKPHOS 91 04/08/2013 1419   ALKPHOS 67 02/19/2011 1323   AST 15 07/27/2013 0939   AST 17 04/08/2013 1419   AST 15 02/19/2011 1323   ALT 16 07/27/2013 0939   ALT 18 04/08/2013 1419   ALT 14 02/19/2011 1323   BILITOT 0.80 07/27/2013 0939   BILITOT 0.41 04/08/2013 1419   BILITOT 0.4 02/19/2011 1323         Impression and Plan: Teresa Anthony is 68 year old white female with severe underlying obstructive lung disease. She has a past history of a stage I squamous cell carcinoma of a right lung. She was then diagnosed with a  non-small cell lung cancer of the left lung. She had this treated with radiotherapy.  She is doing well from my point of view. I don't see anything oncologic that we have to worry about on her.  I think that we'll have to repeat another CT scan on her in about 3 months. We we can do this today that we see her.  Volanda Napoleon, MD 6/1/20153:30 PM

## 2013-09-09 ENCOUNTER — Encounter: Payer: Self-pay | Admitting: *Deleted

## 2013-09-12 ENCOUNTER — Telehealth: Payer: Self-pay | Admitting: Radiation Oncology

## 2013-09-12 NOTE — Telephone Encounter (Signed)
Spoke w/pt to remind her of her appt. W/Dr. Valere Dross tomorrow. She was agreeable. (lw)

## 2013-09-13 ENCOUNTER — Ambulatory Visit
Admission: RE | Admit: 2013-09-13 | Discharge: 2013-09-13 | Disposition: A | Discharge status: Home / Self Care | Payer: Medicare Other | Source: Ambulatory Visit | Attending: Radiation Oncology | Admitting: Radiation Oncology

## 2013-09-13 ENCOUNTER — Encounter: Payer: Self-pay | Admitting: Radiation Oncology

## 2013-09-13 VITALS — BP 120/78 | HR 102 | Temp 98.1°F | Resp 20 | Wt 182.0 lb

## 2013-09-13 DIAGNOSIS — C3492 Malignant neoplasm of unspecified part of left bronchus or lung: Secondary | ICD-10-CM

## 2013-09-13 NOTE — Progress Notes (Signed)
Patient denies pain, loss of appetite, cough. She reports SOB w/minimal activity, remains on O2 @ 2L /min. She is fatigued. She states her feet are uncomfortable due to swelling; she is taking Metolazone for this.

## 2013-09-13 NOTE — Progress Notes (Signed)
CC: Dr. Burney Gauze  Followup note:  Teresa Anthony returns today approximately 3 months following extreme hyperfractionated radiotherapy in the management of her non-small cell carcinoma of the left lung. She states that her dyspnea varies from day-to-day, but may be somewhat worse and it was 2-3 months ago. A CT scan of the chest almost 2 months following her ration therapy showed almost complete resolution of the left hilar mass with improvement of the postobstructive pneumonitis. Dr. Marin Olp has her scheduled for a followup CT scan in early September along with a followup visit. She remains on 2 L of nasal O2.  Physical examination: Alert and oriented. Filed Vitals:   09/13/13 1503  BP: 120/78  Pulse: 102  Temp: 98.1 F (36.7 C)  Resp: 20   Nodes: There is no palpable cervical or supraclavicular lymphadenopathy. Chest: Bilateral wheezes, no consolidation.  Impression: Clinically stable but subjectively more dyspnea on occasion. I doubt that this is related to radiation fibrosis since she is only 3 months out from her radiation therapy. In any case, she'll have a followup CT scan in early September. Since she is being followed closely by Dr. Marin Olp, she does not need to return here for a formal followup visit.  Plan: As above.  15 minutes was spent face-to-face the patient, primarily counseling the patient in reviewing her scan results.

## 2013-10-17 ENCOUNTER — Telehealth: Payer: Self-pay | Admitting: Hematology & Oncology

## 2013-10-17 NOTE — Telephone Encounter (Signed)
RN aware pt moved 9-2 to 9-23

## 2013-11-02 ENCOUNTER — Other Ambulatory Visit: Payer: Medicare Other | Admitting: Lab

## 2013-11-02 ENCOUNTER — Other Ambulatory Visit (HOSPITAL_BASED_OUTPATIENT_CLINIC_OR_DEPARTMENT_OTHER): Payer: Medicare Other

## 2013-11-02 ENCOUNTER — Ambulatory Visit: Payer: Medicare Other | Admitting: Hematology & Oncology

## 2013-11-18 ENCOUNTER — Telehealth: Payer: Self-pay | Admitting: Hematology & Oncology

## 2013-11-18 NOTE — Telephone Encounter (Signed)
Patient called and cx 11/23/13 apt.  Patient will call back to resch

## 2013-11-23 ENCOUNTER — Ambulatory Visit: Payer: Medicare Other | Admitting: Hematology & Oncology

## 2013-11-23 ENCOUNTER — Ambulatory Visit (HOSPITAL_BASED_OUTPATIENT_CLINIC_OR_DEPARTMENT_OTHER): Admission: RE | Admit: 2013-11-23 | Payer: Medicare Other | Source: Ambulatory Visit

## 2013-11-23 ENCOUNTER — Other Ambulatory Visit: Payer: Medicare Other | Admitting: Lab

## 2014-12-02 DEATH — deceased

## 2015-08-18 IMAGING — CT CT CHEST W/ CM
2 of 3 series · 15 of 36 positions shown, 18 images · IV contrast (APPLIED)
Comparison: 05/11/2013; 04/08/2013

CLINICAL DATA: Small cell lung cancer. Left-sided mass. Radiation
therapy completed last month.

EXAM:
CT CHEST WITH CONTRAST
TECHNIQUE: Multidetector CT imaging of the chest was performed during
intravenous contrast administration.
CONTRAST:  80mL OMNIPAQUE IOHEXOL 300 MG/ML  SOLN

[Series 2: chest 5.0 b31f · axial · 0.79mm/px · z∈[-292,-52]mm · 12 of 58 slices shown, 15 images]
[im 5/58  mediastinal]
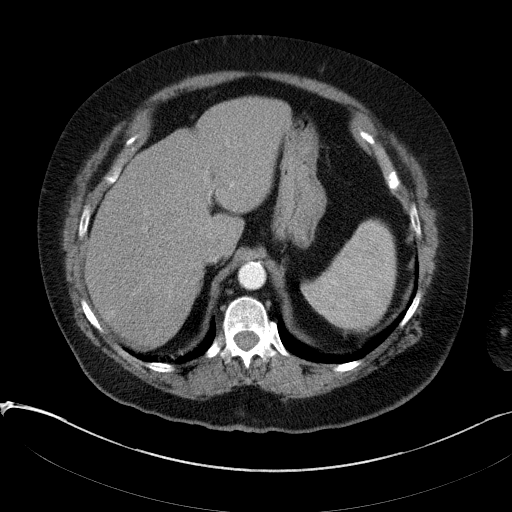
[im 5/58  lung]
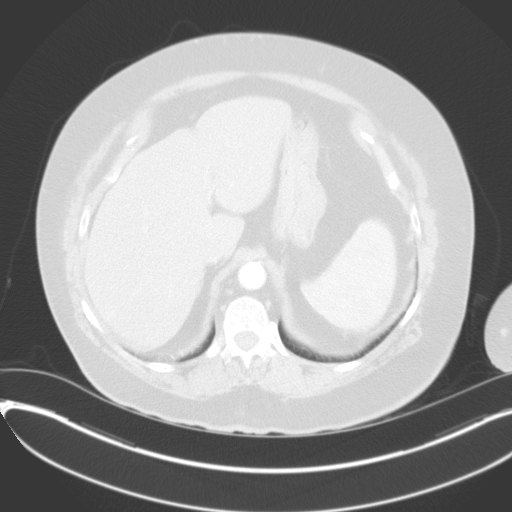
[im 9/58  lung]
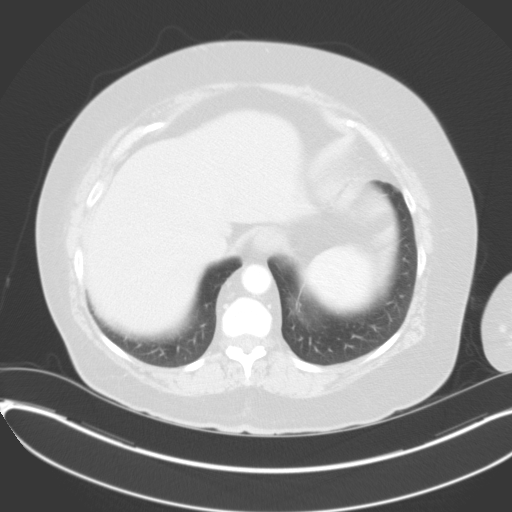
[im 13/58  lung]
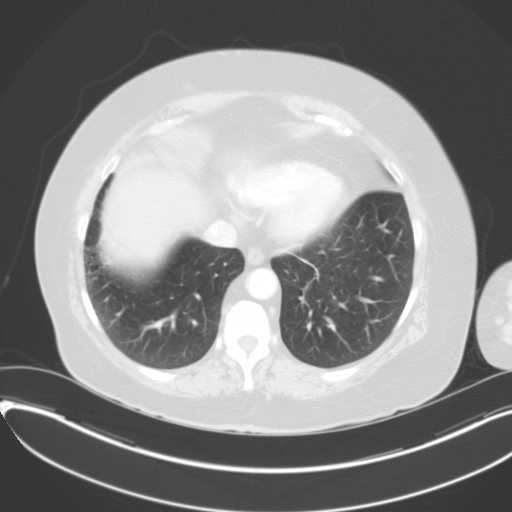
[im 17/58  lung]
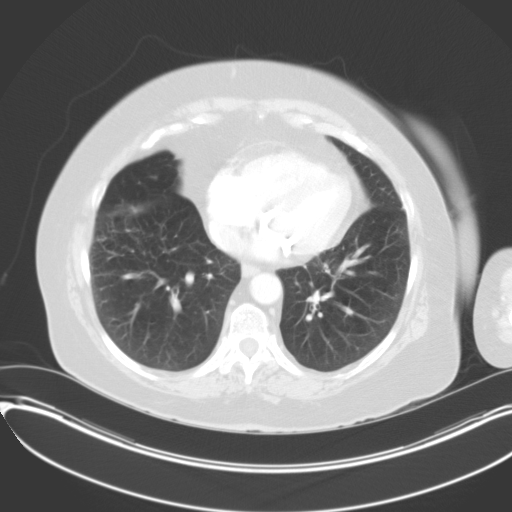
[im 22/58  mediastinal]
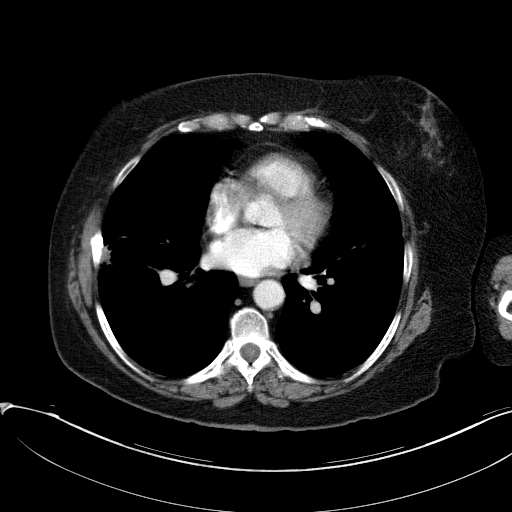
[im 22/58  lung]
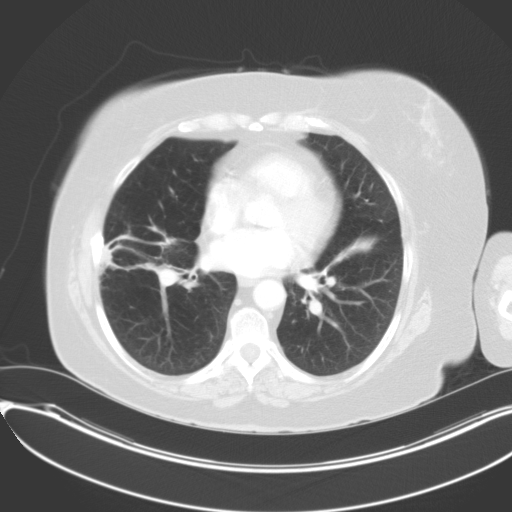
[im 26/58  lung]
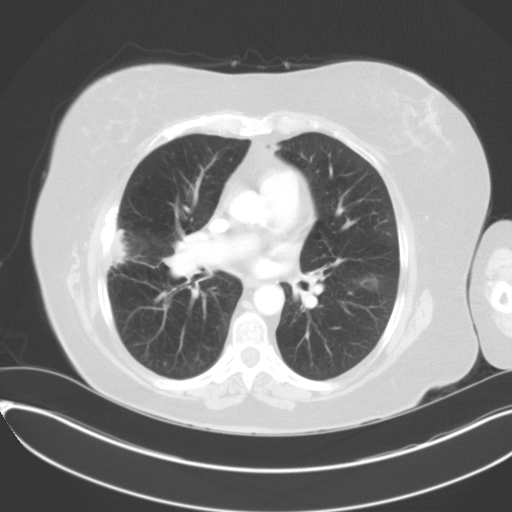
[im 32/58  lung]
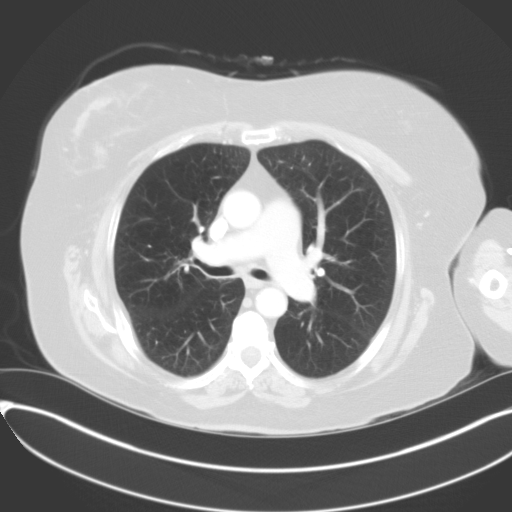
[im 36/58  lung]
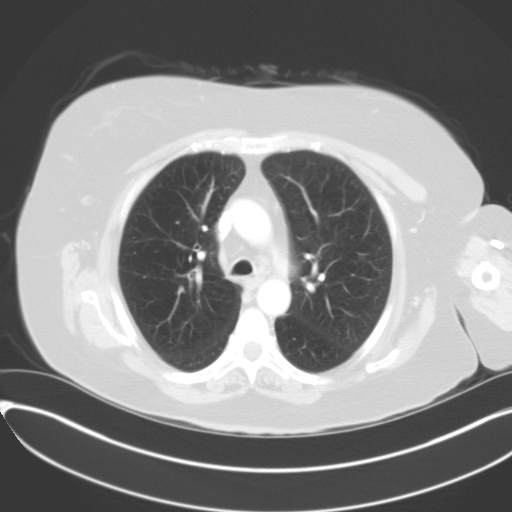
[im 41/58  mediastinal]
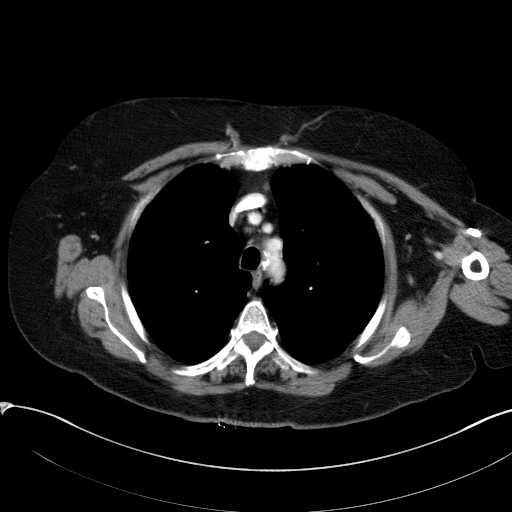
[im 41/58  lung]
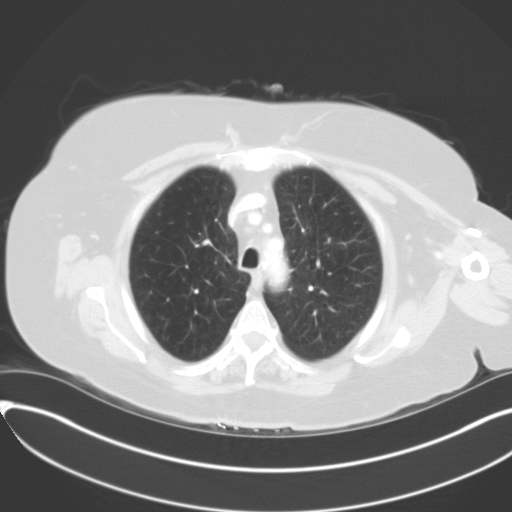
[im 45/58  lung]
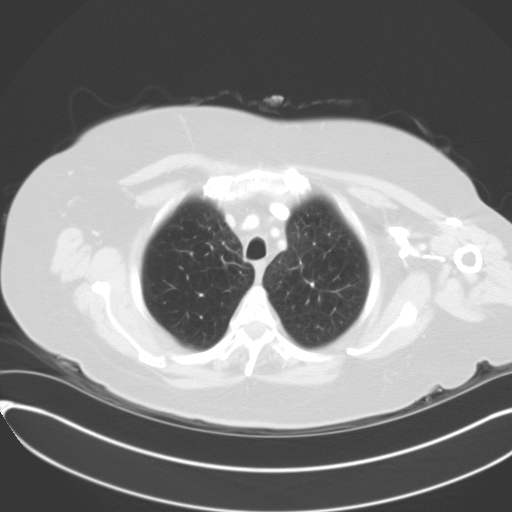
[im 49/58  lung]
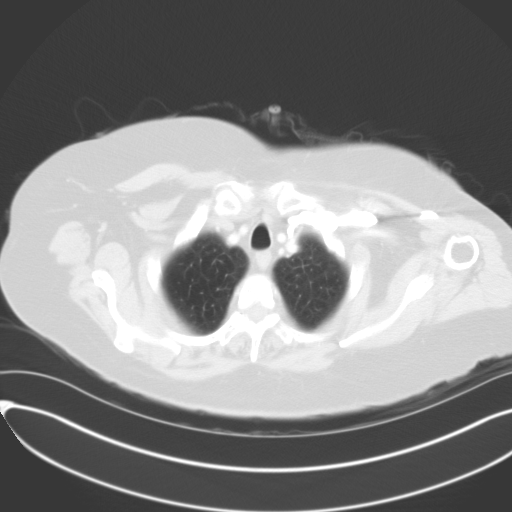
[im 53/58  lung]
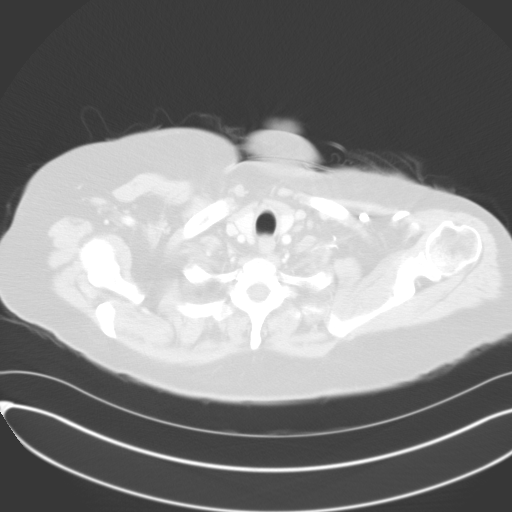

[Series 6: chest 3.0 coronal · coronal · 0.57mm/px · 3 of 100 slices shown]
[im 20/100  lung]
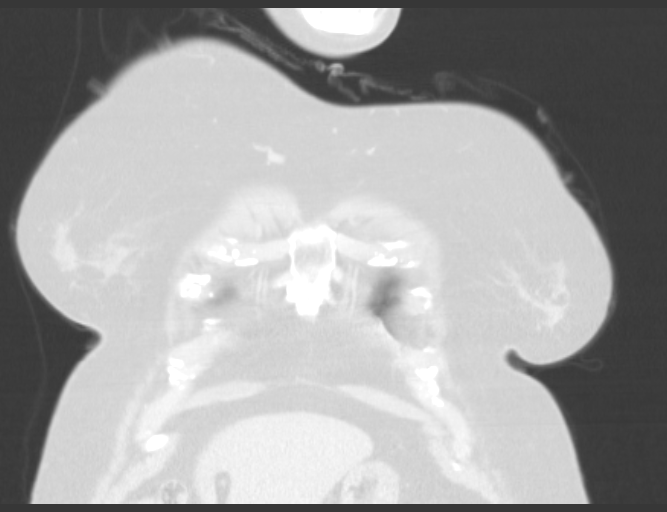
[im 40/100  lung]
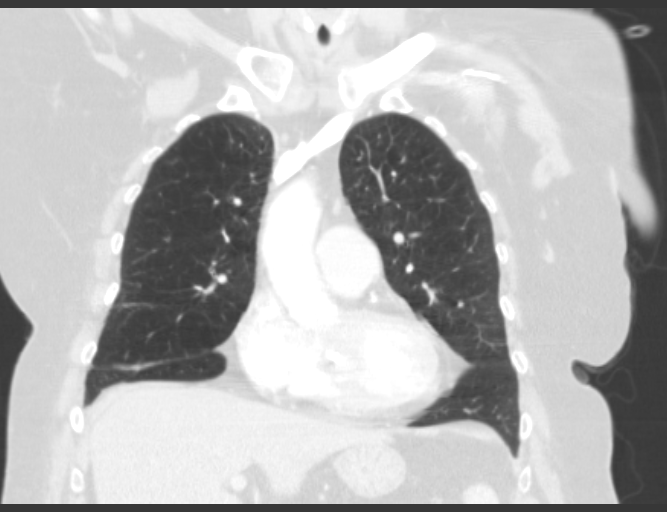
[im 60/100  lung]
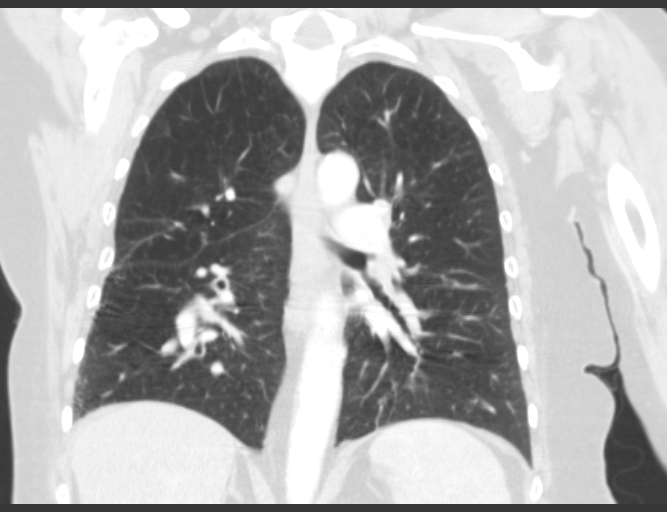

[15 of 36 positions shown; findings below may reference images not displayed]

FINDINGS: Prominent reduction in the mass along the right posterior aspect of
the hilum noted. There continues to be truncation of the superior
segmental bronchus of the left lower lobe, potentially with complete
occlusion, but the previously hypermetabolic 1.8 x 1.3 cm soft
tissue density in this vicinity has nearly completely resolved, with
only some residual airway thickening and tapering of the superior
segmental airway. The downstream nodular airspace opacities have
likewise resolved in the superior segment left lower lobe.

There is mild scarring along the left major fissure. Continued right
mid lung scarring and pleural thickening noted with adjacent rib
irregularities likely related to prior therapy, no change in
morphology and appearance.

Severe emphysema. Left lower paratracheal lymph nodes measure up to
0.7 cm in short axis.

Coronary artery atherosclerosis noted. Mitral annular calcification.
IMPRESSION: 1. The left posterior hilar mass as nearly completely resolved, with
some airway thickening and potentially partial or complete occlusion
of the superior segmental airway of the left lower lobe, but with
resolution of the local mass effect and resolution of the
postobstructive pneumonitis.
2. Stable scarring peripherally in the right mid lung.
3. Emphysema, atherosclerosis, and mitral annular calcification.
